# Patient Record
Sex: Female | Born: 1955 | Hispanic: No | Smoking: Never smoker
Health system: Southern US, Community
[De-identification: ages and names within clinical notes are randomized; demographics above are authoritative.]

## PROBLEM LIST (undated history)

## (undated) DIAGNOSIS — E785 Hyperlipidemia, unspecified: Secondary | ICD-10-CM

## (undated) DIAGNOSIS — E119 Type 2 diabetes mellitus without complications: Secondary | ICD-10-CM

## (undated) DIAGNOSIS — I10 Essential (primary) hypertension: Secondary | ICD-10-CM

## (undated) HISTORY — PX: CARDIAC CATHETERIZATION: SHX172

---

## 2008-01-04 ENCOUNTER — Emergency Department (HOSPITAL_COMMUNITY): Admission: EM | Admit: 2008-01-04 | Discharge: 2008-01-04 | Payer: Self-pay | Admitting: Emergency Medicine

## 2014-10-13 ENCOUNTER — Ambulatory Visit: Payer: Self-pay | Admitting: General Practice

## 2015-10-05 ENCOUNTER — Other Ambulatory Visit: Payer: Self-pay | Admitting: Family Medicine

## 2015-10-05 ENCOUNTER — Ambulatory Visit
Admission: RE | Admit: 2015-10-05 | Discharge: 2015-10-05 | Disposition: A | Discharge status: Home / Self Care | Payer: No Typology Code available for payment source | Source: Ambulatory Visit | Attending: Family Medicine | Admitting: Family Medicine

## 2015-10-05 DIAGNOSIS — Z1231 Encounter for screening mammogram for malignant neoplasm of breast: Secondary | ICD-10-CM | POA: Diagnosis not present

## 2016-07-10 ENCOUNTER — Other Ambulatory Visit: Payer: Self-pay | Admitting: Family Medicine

## 2016-07-11 LAB — CMP12+LP+TP+TSH+6AC+CBC/D/PLT
A/G RATIO: 1.8 (ref 1.2–2.2)
ALBUMIN: 4.4 g/dL (ref 3.6–4.8)
ALT: 18 IU/L (ref 0–32)
AST: 17 IU/L (ref 0–40)
Alkaline Phosphatase: 67 IU/L (ref 39–117)
BASOS ABS: 0 10*3/uL (ref 0.0–0.2)
BILIRUBIN TOTAL: 0.3 mg/dL (ref 0.0–1.2)
BUN / CREAT RATIO: 21 (ref 12–28)
BUN: 12 mg/dL (ref 8–27)
Basos: 1 %
CHOLESTEROL TOTAL: 261 mg/dL — AB (ref 100–199)
CREATININE: 0.57 mg/dL (ref 0.57–1.00)
Calcium: 9.4 mg/dL (ref 8.7–10.3)
Chloride: 103 mmol/L (ref 96–106)
Chol/HDL Ratio: 5.4 ratio units — ABNORMAL HIGH (ref 0.0–4.4)
EOS (ABSOLUTE): 0.3 10*3/uL (ref 0.0–0.4)
ESTIMATED CHD RISK: 1.5 times avg. — AB (ref 0.0–1.0)
Eos: 5 %
FREE THYROXINE INDEX: 1.7 (ref 1.2–4.9)
GFR calc Af Amer: 117 mL/min/{1.73_m2} (ref >59)
GFR, EST NON AFRICAN AMERICAN: 101 mL/min/{1.73_m2} (ref >59)
GGT: 29 IU/L (ref 0–60)
GLUCOSE: 137 mg/dL — AB (ref 65–99)
Globulin, Total: 2.4 g/dL (ref 1.5–4.5)
HDL: 48 mg/dL (ref >39)
Hematocrit: 42.5 % (ref 34.0–46.6)
Hemoglobin: 13.4 g/dL (ref 11.1–15.9)
IRON: 62 ug/dL (ref 27–159)
Immature Grans (Abs): 0 10*3/uL (ref 0.0–0.1)
Immature Granulocytes: 0 %
LDH: 157 IU/L (ref 119–226)
LDL Calculated: 183 mg/dL — ABNORMAL HIGH (ref 0–99)
LYMPHS ABS: 1.4 10*3/uL (ref 0.7–3.1)
Lymphs: 28 %
MCH: 27.1 pg (ref 26.6–33.0)
MCHC: 31.5 g/dL (ref 31.5–35.7)
MCV: 86 fL (ref 79–97)
MONOS ABS: 0.4 10*3/uL (ref 0.1–0.9)
Monocytes: 7 %
NEUTROS PCT: 59 %
Neutrophils Absolute: 3 10*3/uL (ref 1.4–7.0)
PHOSPHORUS: 3.3 mg/dL (ref 2.5–4.5)
PLATELETS: 205 10*3/uL (ref 150–379)
Potassium: 4.2 mmol/L (ref 3.5–5.2)
RBC: 4.95 x10E6/uL (ref 3.77–5.28)
RDW: 14.4 % (ref 12.3–15.4)
Sodium: 140 mmol/L (ref 134–144)
T3 UPTAKE RATIO: 21 % — AB (ref 24–39)
T4 TOTAL: 7.9 ug/dL (ref 4.5–12.0)
TOTAL PROTEIN: 6.8 g/dL (ref 6.0–8.5)
TSH: 1.51 u[IU]/mL (ref 0.450–4.500)
Triglycerides: 149 mg/dL (ref 0–149)
URIC ACID: 4.9 mg/dL (ref 2.5–7.1)
VLDL Cholesterol Cal: 30 mg/dL (ref 5–40)
WBC: 5.1 10*3/uL (ref 3.4–10.8)

## 2016-07-11 LAB — VITAMIN D 25 HYDROXY (VIT D DEFICIENCY, FRACTURES): VIT D 25 HYDROXY: 18.5 ng/mL — AB (ref 30.0–100.0)

## 2016-07-11 LAB — HGB A1C W/O EAG: HEMOGLOBIN A1C: 6.6 % — AB (ref 4.8–5.6)

## 2021-02-10 DIAGNOSIS — I2510 Atherosclerotic heart disease of native coronary artery without angina pectoris: Secondary | ICD-10-CM

## 2021-02-10 HISTORY — DX: Atherosclerotic heart disease of native coronary artery without angina pectoris: I25.10

## 2021-04-06 ENCOUNTER — Encounter (HOSPITAL_COMMUNITY): Payer: Self-pay | Admitting: *Deleted

## 2021-04-06 NOTE — Progress Notes (Signed)
Received [facility] [id number] from Dr. Andee Poles at the [facility] for this pt to participate in Cardiac rehab s/p 3/24 STEMI and DES to RCA at [facility].  Reviewed accompany progress notes which included post hospitalization follow up with her PCP Dr. Vinnie Level on 4/8 and cardiology clinic on 4/4.  Pt is progressing well. Called and left message for [facility] contact in Care in the community requesting a copy of the 12 lead ekg tracing that was completed on 4/4. Called and verified with pt that [place] [facility] [place] [facility] she would like to participate in [place].  Pt indicated that she would.  Pt made aware that we have a long wait list for scheduling. Verbalized understanding as she will be leaving next week traveling for a period of time.will forward to support staff to contact pt for scheduling during her turn on the wait list Karlene Lineman RN, BSN Cardiac and Pulmonary Rehab Nurse Navigator

## 2021-05-13 ENCOUNTER — Telehealth (HOSPITAL_COMMUNITY): Payer: Self-pay

## 2021-05-13 ENCOUNTER — Encounter (HOSPITAL_COMMUNITY): Payer: Self-pay

## 2021-05-13 NOTE — Telephone Encounter (Signed)
Attempted to call patient in regards to Cardiac Rehab - LM on VM Mailed letter 

## 2021-05-27 NOTE — Telephone Encounter (Signed)
  Pt called and stated she is interested in CR. Patient will come in for orientation on 05/31/21 @10 :30AM and will attend the 1:15PM exercise class.    Pt called and stated she is interested in CR. Patient will come in for orientation on 05/31/21 @10 :30AM and will attend the 1:15PM exercise class.   06/02/21.

## 2021-05-30 ENCOUNTER — Telehealth (HOSPITAL_COMMUNITY): Payer: Self-pay | Admitting: *Deleted

## 2021-05-30 ENCOUNTER — Telehealth (HOSPITAL_COMMUNITY): Payer: Self-pay | Admitting: Pharmacist

## 2021-05-30 NOTE — Telephone Encounter (Signed)
Left message to call cardiac rehab regarding appointment for cardiac rehab orientation.Gladstone Lighter, RN,BSN 05/30/2021 4:29 PM

## 2021-05-31 ENCOUNTER — Encounter (HOSPITAL_COMMUNITY)
Admission: RE | Admit: 2021-05-31 | Discharge: 2021-05-31 | Disposition: A | Discharge status: Home / Self Care | Payer: No Typology Code available for payment source | Source: Ambulatory Visit | Attending: Cardiology | Admitting: Cardiology

## 2021-05-31 ENCOUNTER — Encounter (HOSPITAL_COMMUNITY): Payer: Self-pay

## 2021-05-31 ENCOUNTER — Other Ambulatory Visit: Payer: Self-pay

## 2021-05-31 VITALS — BP 112/68 | HR 77 | Ht 66.5 in | Wt 190.0 lb

## 2021-05-31 DIAGNOSIS — Z955 Presence of coronary angioplasty implant and graft: Secondary | ICD-10-CM | POA: Diagnosis present

## 2021-05-31 DIAGNOSIS — I213 ST elevation (STEMI) myocardial infarction of unspecified site: Secondary | ICD-10-CM | POA: Insufficient documentation

## 2021-05-31 HISTORY — DX: Hyperlipidemia, unspecified: E78.5

## 2021-05-31 HISTORY — DX: Essential (primary) hypertension: I10

## 2021-05-31 HISTORY — DX: Type 2 diabetes mellitus without complications: E11.9

## 2021-05-31 LAB — GLUCOSE, CAPILLARY: Glucose-Capillary: 168 mg/dL — ABNORMAL HIGH (ref 70–99)

## 2021-05-31 NOTE — Progress Notes (Signed)
Cardiac Rehab Medication Review by a Nurse  Does the patient  feel that his/her medications are working for him/her?  yes  Has the patient been experiencing any side effects to the medications prescribed?  no  Does the patient measure his/her own blood pressure or blood glucose at home?  yes   Does the patient have any problems obtaining medications due to transportation or finances?   no  Understanding of regimen: good Understanding of indications: good Potential of compliance: good    Nurse comments: Medications  and allergies reviewed and updated. Patient brought bottle. Harika is taking her medications as prescribed and has a good understanding of what they are for. Adelle checks her blood pressures at home but does not check her CBG's.    Arta Bruce [facility] RN 05/31/2021 11:13 AM

## 2021-05-31 NOTE — Progress Notes (Signed)
Cardiac Individual Treatment Plan  Patient Details  Name: Taylor Frazier MRN: [id number] Date of Birth: April 07, 1956 Referring Provider:   Flowsheet Row CARDIAC REHAB PHASE II ORIENTATION from 05/31/2021 in [facility] CARDIAC [facility]  Referring Provider Wanchese, [place], MD ([place])  Mayford Knife, Cornelious Bryant, MD (covering)]       Initial Encounter Date:  Flowsheet Row CARDIAC REHAB PHASE II ORIENTATION from 05/31/2021 in [facility] CARDIAC REHAB  Date 05/31/21       Visit Diagnosis: 03/10/21 STEMI [facility]  03/10/21 DES RCA  Patient's Home Medications on Admission:  Current Outpatient Medications:    aspirin 81 MG chewable tablet, Chew 81 mg by mouth daily., Disp: , Rfl:    atorvastatin (LIPITOR) 80 MG tablet, Take 80 mg by mouth daily., Disp: , Rfl:    Cholecalciferol 25 MCG (1000 UT) tablet, Take 1,000 Units by mouth daily., Disp: , Rfl:    metFORMIN (GLUCOPHAGE) 500 MG tablet, Take 500 mg by mouth 2 (two) times daily with a meal., Disp: , Rfl:    metoprolol succinate (TOPROL-XL) 25 MG 24 hr tablet, Take 12.5 mg by mouth daily., Disp: , Rfl:    Multiple Vitamin (MULTIVITAMIN WITH MINERALS) TABS tablet, Take 1 tablet by mouth daily., Disp: , Rfl:    nitroGLYCERIN (NITROSTAT) 0.4 MG SL tablet, Place 0.4 mg under the tongue every 5 (five) minutes as needed for chest pain., Disp: , Rfl:    prasugrel (EFFIENT) 10 MG TABS tablet, Take 10 mg by mouth daily., Disp: , Rfl:   Past Medical History: Past Medical History:  Diagnosis Date   Coronary artery disease 02/10/2021   STEMI Late Presenting DES RCA at [facility]   Diabetes mellitus without complication (HCC)    Hyperlipidemia    Hypertension     Tobacco Use: Social History   Tobacco Use  Smoking Status Never  Smokeless Tobacco Never    Labs: Recent Review Flowsheet Data     Labs for ITP Cardiac and Pulmonary Rehab Latest Ref Rng & Units 07/10/2016    Cholestrol 100 - 199 mg/dL 361(W)   LDLCALC 0 - 99 mg/dL 431(V)   HDL >40 mg/dL 48   Trlycerides 0 - 086 mg/dL 761   Hemoglobin P5K 4.8 - 5.6 % 6.6(H)       Capillary Blood Glucose: Lab Results  Component Value Date   GLUCAP 168 (H) 05/31/2021     Exercise Target Goals: Exercise Program Goal: Individual exercise prescription set using results from initial 6 min walk test and THRR while considering  patient's activity barriers and safety.   Exercise Prescription Goal: Starting with aerobic activity 30 plus minutes a day, 3 days per week for initial exercise prescription. Provide home exercise prescription and guidelines that participant acknowledges understanding prior to discharge.  Activity Barriers & Risk Stratification:  Activity Barriers & Cardiac Risk Stratification - 05/31/21 1237       Activity Barriers & Cardiac Risk Stratification   Activity Barriers None    Cardiac Risk Stratification High             6 Minute Walk:  6 Minute Walk     Row Name 05/31/21 1137         6 Minute Walk   Phase Initial     Distance 1931 feet     Walk Time 6 minutes     # of Rest Breaks 0     MPH 3.66  METS 4.39     RPE 11     Perceived Dyspnea  0     VO2 Peak 15.35     Symptoms No     Resting HR 77 bpm     Resting BP 112/68     Resting Oxygen Saturation  97 %     Exercise Oxygen Saturation  during 6 min walk 98 %     Max Ex. HR 117 bpm     Max Ex. BP 144/72     2 Minute Post BP 128/84              Oxygen Initial Assessment:   Oxygen Re-Evaluation:   Oxygen Discharge (Final Oxygen Re-Evaluation):   Initial Exercise Prescription:  Initial Exercise Prescription - 05/31/21 1200       Date of Initial Exercise RX and Referring Provider   Date 05/31/21    Referring Provider Sarajane Marek, [place] ([place]   Quintella Reichert, MD (covering)   Expected Discharge Date 07/29/21      Treadmill   MPH 2.6    Grade 1    Minutes 15    METs 3.35      NuStep    Level 3    SPM 85    Minutes 15    METs 2.5      Prescription Details   Frequency (times per week) 3    Duration Progress to 30 minutes of continuous aerobic without signs/symptoms of physical distress      Intensity   THRR 40-80% of Max Heartrate 62-124    Ratings of Perceived Exertion 11-13    Perceived Dyspnea 0-4      Progression   Progression Continue to progress workloads to maintain intensity without signs/symptoms of physical distress.      Resistance Training   Training Prescription Yes    Weight 3 lbs    Reps 10-15             Perform Capillary Blood Glucose checks as needed.  Exercise Prescription Changes:   Exercise Comments:   Exercise Goals and Review:   Exercise Goals     Row Name 05/31/21 1043             Exercise Goals   Increase Physical Activity Yes       Intervention Provide advice, education, support and counseling about physical activity/exercise needs.;Develop an individualized exercise prescription for aerobic and resistive training based on initial evaluation findings, risk stratification, comorbidities and participant's personal goals.       Expected Outcomes Short Term: Attend rehab on a regular basis to increase amount of physical activity.;Long Term: Exercising regularly at least 3-5 days a week.;Long Term: Add in home exercise to make exercise part of routine and to increase amount of physical activity.       Increase Strength and Stamina Yes       Intervention Provide advice, education, support and counseling about physical activity/exercise needs.;Develop an individualized exercise prescription for aerobic and resistive training based on initial evaluation findings, risk stratification, comorbidities and participant's personal goals.       Expected Outcomes Short Term: Increase workloads from initial exercise prescription for resistance, speed, and METs.;Short Term: Perform resistance training exercises routinely during rehab and add  in resistance training at home;Long Term: Improve cardiorespiratory fitness, muscular endurance and strength as measured by increased METs and functional capacity ( )       Able to understand and use rate of perceived exertion (RPE) scale Yes  Intervention Provide education and explanation on how to use RPE scale       Expected Outcomes Short Term: Able to use RPE daily in rehab to express subjective intensity level;Long Term:  Able to use RPE to guide intensity level when exercising independently       Knowledge and understanding of Target Heart Rate Range (THRR) Yes       Intervention Provide education and explanation of THRR including how the numbers were predicted and where they are located for reference       Expected Outcomes Short Term: Able to state/look up THRR;Long Term: Able to use THRR to govern intensity when exercising independently;Short Term: Able to use daily as guideline for intensity in rehab       Able to check pulse independently Yes       Intervention Provide education and demonstration on how to check pulse in carotid and radial arteries.;Review the importance of being able to check your own pulse for safety during independent exercise       Expected Outcomes Short Term: Able to explain why pulse checking is important during independent exercise;Long Term: Able to check pulse independently and accurately       Understanding of Exercise Prescription Yes       Intervention Provide education, explanation, and written materials on patient's individual exercise prescription       Expected Outcomes Short Term: Able to explain program exercise prescription;Long Term: Able to explain home exercise prescription to exercise independently                Exercise Goals Re-Evaluation :    Discharge Exercise Prescription (Final Exercise Prescription Changes):   Nutrition:  Target Goals: Understanding of nutrition guidelines, daily intake of sodium 1500mg , cholesterol  200mg , calories 30% from fat and 7% or less from saturated fats, daily to have 5 or more servings of fruits and vegetables.  Biometrics:  Pre Biometrics - 05/31/21 1024       Pre Biometrics   Waist Circumference 42 inches    Hip Circumference 43.25 inches    Waist to Hip Ratio 0.97 %    Triceps Skinfold 25 mm    % Body Fat 41.6 %    Grip Strength 41.5 kg    Flexibility 11.75 in    Single Leg Stand 30 seconds              Nutrition Therapy Plan and Nutrition Goals:   Nutrition Assessments:  MEDIFICTS Score Key: ?70 Need to make dietary changes  40-70 Heart Healthy Diet ? 40 Therapeutic Level Cholesterol Diet   Picture Your Plate Scores: <40<40 Unhealthy dietary pattern with much room for improvement. 41-50 Dietary pattern unlikely to meet recommendations for good health and room for improvement. 51-60 More healthful dietary pattern, with some room for improvement.  >60 Healthy dietary pattern, although there may be some specific behaviors that could be improved.    Nutrition Goals Re-Evaluation:   Nutrition Goals Discharge (Final Nutrition Goals Re-Evaluation):   Psychosocial: Target Goals: Acknowledge presence or absence of significant depression and/or stress, maximize coping skills, provide positive support system. Participant is able to verbalize types and ability to use techniques and skills needed for reducing stress and depression.  Initial Review & Psychosocial Screening:  Initial Psych Review & Screening - 05/31/21 1123       Initial Review   Current issues with Current Stress Concerns    Source of Stress Concerns Family    Comments Corina's mother has  dementia. Ladonne's mother lives in [place]. Amran has the help of her siblings in the care of her mother      Family Dynamics   Good Support System? Yes   Aliviana lives alone. Yen has her 3 children who live out of state and her siblings for support     Barriers   Psychosocial barriers to  participate in program The patient should benefit from training in stress management and relaxation.      Screening Interventions   Interventions Encouraged to exercise    Expected Outcomes Long Term Goal: Stressors or current issues are controlled or eliminated.;Short Term goal: Identification and review with participant of any Quality of Life or Depression concerns found by scoring the questionnaire.             Quality of Life Scores:  Quality of Life - 05/31/21 1230       Quality of Life   Select Quality of Life      Quality of Life Scores   Health/Function Pre 18.67 %    Socioeconomic Pre 16.21 %    Psych/Spiritual Pre 15.5 %    Family Pre 22.5 %    GLOBAL Pre 18.07 %            Scores of 19 and below usually indicate a poorer quality of life in these areas.  A difference of  2-3 points is a clinically meaningful difference.  A difference of 2-3 points in the total score of the Quality of Life Index has been associated with significant improvement in overall quality of life, self-image, physical symptoms, and general health in studies assessing change in quality of life.  PHQ-9: Recent Review Flowsheet Data     Depression screen [facility] 2/9 05/31/2021   Decreased Interest 0   Down, Depressed, Hopeless 0   PHQ - 2 Score 0      Interpretation of Total Score  Total Score Depression Severity:  1-4 = Minimal depression, 5-9 = Mild depression, 10-14 = Moderate depression, 15-19 = Moderately severe depression, 20-27 = Severe depression   Psychosocial Evaluation and Intervention:   Psychosocial Re-Evaluation:   Psychosocial Discharge (Final Psychosocial Re-Evaluation):   Vocational Rehabilitation: Provide vocational rehab assistance to qualifying candidates.   Vocational Rehab Evaluation & Intervention:  Vocational Rehab - 05/31/21 1127       Initial Vocational Rehab Evaluation & Intervention   Assessment shows need for Vocational Rehabilitation No   Jacalynn is  retired and does not need vocational rehab at this time.            Education: Education Goals: Education classes will be provided on a weekly basis, covering required topics. Participant will state understanding/return demonstration of topics presented.  Learning Barriers/Preferences:  Learning Barriers/Preferences - 05/31/21 1516       Learning Barriers/Preferences   Learning Barriers Sight    Learning Preferences Skilled Demonstration;Pictoral;Video;Computer/Internet             Education Topics: Hypertension, Hypertension Reduction -Define heart disease and high blood pressure. Discus how high blood pressure affects the body and ways to reduce high blood pressure.   Exercise and Your Heart -Discuss why it is important to exercise, the FITT principles of exercise, normal and abnormal responses to exercise, and how to exercise safely.   Angina -Discuss definition of angina, causes of angina, treatment of angina, and how to decrease risk of having angina.   Cardiac Medications -Review what the following cardiac medications are used for, how they affect  the body, and side effects that may occur when taking the medications.  Medications include Aspirin, Beta blockers, calcium channel blockers, ACE Inhibitors, angiotensin receptor blockers, diuretics, digoxin, and antihyperlipidemics.   Congestive Heart Failure -Discuss the definition of CHF, how to live with CHF, the signs and symptoms of CHF, and how keep track of weight and sodium intake.   Heart Disease and Intimacy -Discus the effect sexual activity has on the heart, how changes occur during intimacy as we age, and safety during sexual activity.   Smoking Cessation / COPD -Discuss different methods to quit smoking, the health benefits of quitting smoking, and the definition of COPD.   Nutrition I: Fats -Discuss the types of cholesterol, what cholesterol does to the heart, and how cholesterol levels can be  controlled.   Nutrition II: Labels -Discuss the different components of food labels and how to read food label   Heart Parts/Heart Disease and PAD -Discuss the anatomy of the heart, the pathway of blood circulation through the heart, and these are affected by heart disease.   Stress I: Signs and Symptoms -Discuss the causes of stress, how stress may lead to anxiety and depression, and ways to limit stress.   Stress II: Relaxation -Discuss different types of relaxation techniques to limit stress.   Warning Signs of Stroke / TIA -Discuss definition of a stroke, what the signs and symptoms are of a stroke, and how to identify when someone is having stroke.   Knowledge Questionnaire Score:  Knowledge Questionnaire Score - 05/31/21 1230       Knowledge Questionnaire Score   Pre Score 18/24             Core Components/Risk Factors/Patient Goals at Admission:  Personal Goals and Risk Factors at Admission - 05/31/21 1231       Core Components/Risk Factors/Patient Goals on Admission    Weight Management Yes;Obesity    Intervention Weight Management/Obesity: Establish reasonable short term and long term weight goals.;Obesity: Provide education and appropriate resources to help participant work on and attain dietary goals.    Admit Weight 190 lb 0.6 oz (86.2 kg)    Expected Outcomes Short Term: Continue to assess and modify interventions until short term weight is achieved;Long Term: Adherence to nutrition and physical activity/exercise program aimed toward attainment of established weight goal;Weight Loss: Understanding of general recommendations for a balanced deficit meal plan, which promotes 1-2 lb weight loss per week and includes a negative energy balance of [phone] kcal/d;Understanding recommendations for meals to include 15-35% energy as protein, 25-35% energy from fat, 35-60% energy from carbohydrates, less than  of dietary cholesterol, 20-35 gm of total fiber  daily;Understanding of distribution of calorie intake throughout the day with the consumption of 4-5 meals/snacks    Diabetes Yes    Intervention Provide education about signs/symptoms and action to take for hypo/hyperglycemia.;Provide education about proper nutrition, including hydration, and aerobic/resistive exercise prescription along with prescribed medications to achieve blood glucose in normal ranges: Fasting glucose 65-99 mg/dL    Expected Outcomes Short Term: Participant verbalizes understanding of the signs/symptoms and immediate care of hyper/hypoglycemia, proper foot care and importance of medication, aerobic/resistive exercise and nutrition plan for blood glucose control.;Long Term: Attainment of HbA1C < 7%.    Hypertension Yes    Intervention Provide education on lifestyle modifcations including regular physical activity/exercise, weight management, moderate sodium restriction and increased consumption of fresh fruit, vegetables, and low fat dairy, alcohol moderation, and smoking cessation.;Monitor prescription use compliance.    Expected  Outcomes Long Term: Maintenance of blood pressure at goal levels.;Short Term: Continued assessment and intervention until BP is < 140/80mm HG in hypertensive participants. < 130/51mm HG in hypertensive participants with diabetes, heart failure or chronic kidney disease.    Lipids Yes    Intervention Provide education and support for participant on nutrition & aerobic/resistive exercise along with prescribed medications to achieve LDL 70mg , HDL >40mg .    Expected Outcomes Short Term: Participant states understanding of desired cholesterol values and is compliant with medications prescribed. Participant is following exercise prescription and nutrition guidelines.;Long Term: Cholesterol controlled with medications as prescribed, with individualized exercise RX and with personalized nutrition plan. Value goals: LDL < 70mg , HDL > 40 mg.    Stress Yes     Intervention Offer individual and/or small group education and counseling on adjustment to heart disease, stress management and health-related lifestyle change. Teach and support self-help strategies.;Refer participants experiencing significant psychosocial distress to appropriate mental health specialists for further evaluation and treatment. When possible, include family members and significant others in education/counseling sessions.    Expected Outcomes Short Term: Participant demonstrates changes in health-related behavior, relaxation and other stress management skills, ability to obtain effective social support, and compliance with psychotropic medications if prescribed.;Long Term: Emotional wellbeing is indicated by absence of clinically significant psychosocial distress or social isolation.             Core Components/Risk Factors/Patient Goals Review:    Core Components/Risk Factors/Patient Goals at Discharge (Final Review):    ITP Comments:  ITP Comments     Row Name 05/31/21 1024           ITP Comments Medical Director- Dr. 06/02/21, MD                Comments:Negar attended orientation on 05/31/2021 to review rules and guidelines for program.  Completed 6 minute walk test, Intitial ITP, and exercise prescription.  VSS. Telemetry-Sinus Rhythm downward QRS t wave inversion.  Asymptomatic. Safety measures and social distancing in place per CDC guidelines. 06/02/2021, RN,BSN 05/31/2021 3:27 PM

## 2021-06-06 ENCOUNTER — Other Ambulatory Visit: Payer: Self-pay

## 2021-06-06 ENCOUNTER — Encounter (HOSPITAL_COMMUNITY)
Admission: RE | Admit: 2021-06-06 | Discharge: 2021-06-06 | Disposition: A | Discharge status: Home / Self Care | Payer: No Typology Code available for payment source | Source: Ambulatory Visit | Attending: Cardiology | Admitting: Cardiology

## 2021-06-06 DIAGNOSIS — I213 ST elevation (STEMI) myocardial infarction of unspecified site: Secondary | ICD-10-CM | POA: Diagnosis not present

## 2021-06-06 DIAGNOSIS — Z955 Presence of coronary angioplasty implant and graft: Secondary | ICD-10-CM

## 2021-06-06 LAB — GLUCOSE, CAPILLARY
Glucose-Capillary: 78 mg/dL (ref 70–99)
Glucose-Capillary: 105 mg/dL — ABNORMAL HIGH (ref 70–99)

## 2021-06-06 NOTE — Progress Notes (Signed)
Cardiac Individual Treatment Plan  Patient Details  Name: Taylor Frazier MRN: [id number] Date of Birth: 1956-03-15 Referring Provider:   Flowsheet Row CARDIAC REHAB PHASE II ORIENTATION from 05/31/2021 in [facility] CARDIAC [facility]  Referring Provider Romancoke, Duque, MD ([place])  Mayford Knife, Cornelious Bryant, MD (covering)]       Initial Encounter Date:  Flowsheet Row CARDIAC REHAB PHASE II ORIENTATION from 05/31/2021 in [facility] CARDIAC REHAB  Date 05/31/21       Visit Diagnosis: 03/10/21 STEMI [facility]  03/10/21 DES RCA  Patient's Home Medications on Admission:  Current Outpatient Medications:    aspirin 81 MG chewable tablet, Chew 81 mg by mouth daily., Disp: , Rfl:    atorvastatin (LIPITOR) 80 MG tablet, Take 80 mg by mouth daily., Disp: , Rfl:    Cholecalciferol 25 MCG (1000 UT) tablet, Take 1,000 Units by mouth daily., Disp: , Rfl:    metFORMIN (GLUCOPHAGE) 500 MG tablet, Take 500 mg by mouth 2 (two) times daily with a meal., Disp: , Rfl:    metoprolol succinate (TOPROL-XL) 25 MG 24 hr tablet, Take 12.5 mg by mouth daily., Disp: , Rfl:    Multiple Vitamin (MULTIVITAMIN WITH MINERALS) TABS tablet, Take 1 tablet by mouth daily., Disp: , Rfl:    nitroGLYCERIN (NITROSTAT) 0.4 MG SL tablet, Place 0.4 mg under the tongue every 5 (five) minutes as needed for chest pain., Disp: , Rfl:    prasugrel (EFFIENT) 10 MG TABS tablet, Take 10 mg by mouth daily., Disp: , Rfl:   Past Medical History: Past Medical History:  Diagnosis Date   Coronary artery disease 02/10/2021   STEMI Late Presenting DES RCA at [facility]   Diabetes mellitus without complication (HCC)    Hyperlipidemia    Hypertension     Tobacco Use: Social History   Tobacco Use  Smoking Status Never  Smokeless Tobacco Never    Labs: Recent Review Flowsheet Data     Labs for ITP Cardiac and Pulmonary Rehab Latest Ref Rng & Units 07/10/2016    Cholestrol 100 - 199 mg/dL 409(W)   LDLCALC 0 - 99 mg/dL 119(J)   HDL >47 mg/dL 48   Trlycerides 0 - 829 mg/dL 562   Hemoglobin Z3Y 4.8 - 5.6 % 6.6(H)       Capillary Blood Glucose: Lab Results  Component Value Date   GLUCAP 105 (H) 06/06/2021   GLUCAP 78 06/06/2021   GLUCAP 133 (H) 06/06/2021   GLUCAP 168 (H) 05/31/2021     Exercise Target Goals: Exercise Program Goal: Individual exercise prescription set using results from initial 6 min walk test and THRR while considering  patient's activity barriers and safety.   Exercise Prescription Goal: Starting with aerobic activity 30 plus minutes a day, 3 days per week for initial exercise prescription. Provide home exercise prescription and guidelines that participant acknowledges understanding prior to discharge.  Activity Barriers & Risk Stratification:  Activity Barriers & Cardiac Risk Stratification - 05/31/21 1237       Activity Barriers & Cardiac Risk Stratification   Activity Barriers None    Cardiac Risk Stratification High             6 Minute Walk:  6 Minute Walk     Row Name 05/31/21 1137         6 Minute Walk   Phase Initial     Distance 1931 feet     Walk Time 6 minutes     #  of Rest Breaks 0     MPH 3.66     METS 4.39     RPE 11     Perceived Dyspnea  0     VO2 Peak 15.35     Symptoms No     Resting HR 77 bpm     Resting BP 112/68     Resting Oxygen Saturation  97 %     Exercise Oxygen Saturation  during 6 min walk 98 %     Max Ex. HR 117 bpm     Max Ex. BP 144/72     2 Minute Post BP 128/84              Oxygen Initial Assessment:   Oxygen Re-Evaluation:   Oxygen Discharge (Final Oxygen Re-Evaluation):   Initial Exercise Prescription:  Initial Exercise Prescription - 05/31/21 1200       Date of Initial Exercise RX and Referring Provider   Date 05/31/21    Referring Provider Sarajane Marek, MD ([place])   Quintella Reichert, MD (covering)   Expected Discharge Date 07/29/21       Treadmill   MPH 2.6    Grade 1    Minutes 15    METs 3.35      NuStep   Level 3    SPM 85    Minutes 15    METs 2.5      Prescription Details   Frequency (times per week) 3    Duration Progress to 30 minutes of continuous aerobic without signs/symptoms of physical distress      Intensity   THRR 40-80% of Max Heartrate 62-124    Ratings of Perceived Exertion 11-13    Perceived Dyspnea 0-4      Progression   Progression Continue to progress workloads to maintain intensity without signs/symptoms of physical distress.      Resistance Training   Training Prescription Yes    Weight 3 lbs    Reps 10-15             Perform Capillary Blood Glucose checks as needed.  Exercise Prescription Changes:   Exercise Prescription Changes     Row Name 06/06/21 1322             Response to Exercise   Blood Pressure (Admit) 118/66       Blood Pressure (Exercise) 132/72       Blood Pressure (Exit) 112/80       Heart Rate (Admit) 86 bpm       Heart Rate (Exercise) 104 bpm       Heart Rate (Exit) 81 bpm       Rating of Perceived Exertion (Exercise) 11       Symptoms none       Comments Off to a good start with exercise.       Duration Continue with 30 min of aerobic exercise without signs/symptoms of physical distress.       Intensity THRR unchanged               Progression     Progression Continue to progress workloads to maintain intensity without signs/symptoms of physical distress.       Average METs 2.9               Resistance Training     Training Prescription Yes       Weight 3 lbs       Reps 10-15       Time  2.9 Minutes               Interval Training     Interval Training No               NuStep     Level 3       SPM 85       Minutes 15       METs 3               Track     Laps 15       Minutes 15       METs 2.74               Exercise Comments:   Exercise Comments     Row Name 06/06/21 1417           Exercise Comments  Patient tolerated low intensity exercise well without symptoms. Patient preferred to walk track instead of treadmill.                Exercise Goals and Review:   Exercise Goals     Row Name 05/31/21 1043             Exercise Goals   Increase Physical Activity Yes       Intervention Provide advice, education, support and counseling about physical activity/exercise needs.;Develop an individualized exercise prescription for aerobic and resistive training based on initial evaluation findings, risk stratification, comorbidities and participant's personal goals.       Expected Outcomes Short Term: Attend rehab on a regular basis to increase amount of physical activity.;Long Term: Exercising regularly at least 3-5 days a week.;Long Term: Add in home exercise to make exercise part of routine and to increase amount of physical activity.       Increase Strength and Stamina Yes       Intervention Provide advice, education, support and counseling about physical activity/exercise needs.;Develop an individualized exercise prescription for aerobic and resistive training based on initial evaluation findings, risk stratification, comorbidities and participant's personal goals.       Expected Outcomes Short Term: Increase workloads from initial exercise prescription for resistance, speed, and METs.;Short Term: Perform resistance training exercises routinely during rehab and add in resistance training at home;Long Term: Improve cardiorespiratory fitness, muscular endurance and strength as measured by increased METs and functional capacity ( )       Able to understand and use rate of perceived exertion (RPE) scale Yes       Intervention Provide education and explanation on how to use RPE scale       Expected Outcomes Short Term: Able to use RPE daily in rehab to express subjective intensity level;Long Term:  Able to use RPE to guide intensity level when exercising independently       Knowledge and  understanding of Target Heart Rate Range (THRR) Yes       Intervention Provide education and explanation of THRR including how the numbers were predicted and where they are located for reference       Expected Outcomes Short Term: Able to state/look up THRR;Long Term: Able to use THRR to govern intensity when exercising independently;Short Term: Able to use daily as guideline for intensity in rehab       Able to check pulse independently Yes       Intervention Provide education and demonstration on how to check pulse in carotid and radial arteries.;Review the importance of being able to check your own pulse for safety during  independent exercise       Expected Outcomes Short Term: Able to explain why pulse checking is important during independent exercise;Long Term: Able to check pulse independently and accurately       Understanding of Exercise Prescription Yes       Intervention Provide education, explanation, and written materials on patient's individual exercise prescription       Expected Outcomes Short Term: Able to explain program exercise prescription;Long Term: Able to explain home exercise prescription to exercise independently                Exercise Goals Re-Evaluation :  Exercise Goals Re-Evaluation     Row Name 06/06/21 1417             Exercise Goal Re-Evaluation   Exercise Goals Review Increase Physical Activity;Able to understand and use rate of perceived exertion (RPE) scale       Comments Patient able to understand and use RPE scale appropriately.       Expected Outcomes Progress workloads as tolerated to help improve cardiorespiratory fitness.                 Discharge Exercise Prescription (Final Exercise Prescription Changes):  Exercise Prescription Changes - 06/06/21 1322       Response to Exercise   Blood Pressure (Admit) 118/66    Blood Pressure (Exercise) 132/72    Blood Pressure (Exit) 112/80    Heart Rate (Admit) 86 bpm    Heart Rate  (Exercise) 104 bpm    Heart Rate (Exit) 81 bpm    Rating of Perceived Exertion (Exercise) 11    Symptoms none    Comments Off to a good start with exercise.    Duration Continue with 30 min of aerobic exercise without signs/symptoms of physical distress.    Intensity THRR unchanged      Progression   Progression Continue to progress workloads to maintain intensity without signs/symptoms of physical distress.    Average METs 2.9      Resistance Training   Training Prescription Yes    Weight 3 lbs    Reps 10-15    Time 2.9 Minutes      Interval Training   Interval Training No      NuStep   Level 3    SPM 85    Minutes 15    METs 3      Track   Laps 15    Minutes 15    METs 2.74             Nutrition:  Target Goals: Understanding of nutrition guidelines, daily intake of sodium 1500mg , cholesterol 200mg , calories 30% from fat and 7% or less from saturated fats, daily to have 5 or more servings of fruits and vegetables.  Biometrics:  Pre Biometrics - 05/31/21 1024       Pre Biometrics   Waist Circumference 42 inches    Hip Circumference 43.25 inches    Waist to Hip Ratio 0.97 %    Triceps Skinfold 25 mm    % Body Fat 41.6 %    Grip Strength 41.5 kg    Flexibility 11.75 in    Single Leg Stand 30 seconds              Nutrition Therapy Plan and Nutrition Goals:   Nutrition Assessments:  MEDIFICTS Score Key: ?70 Need to make dietary changes  40-70 Heart Healthy Diet ? 40 Therapeutic Level Cholesterol Diet   Picture Your Plate Scores: <  40 Unhealthy dietary pattern with much room for improvement. 41-50 Dietary pattern unlikely to meet recommendations for good health and room for improvement. 51-60 More healthful dietary pattern, with some room for improvement.  >60 Healthy dietary pattern, although there may be some specific behaviors that could be improved.    Nutrition Goals Re-Evaluation:   Nutrition Goals Discharge (Final Nutrition Goals  Re-Evaluation):   Psychosocial: Target Goals: Acknowledge presence or absence of significant depression and/or stress, maximize coping skills, provide positive support system. Participant is able to verbalize types and ability to use techniques and skills needed for reducing stress and depression.  Initial Review & Psychosocial Screening:  Initial Psych Review & Screening - 05/31/21 1123       Initial Review   Current issues with Current Stress Concerns    Source of Stress Concerns Family    Comments Nashley's mother has dementia. Nakayla's mother lives in [place]. Oveta has the help of her siblings in the care of her mother      Family Dynamics   Good Support System? Yes   Abbigal lives alone. Kashayla has her 3 children who live out of state and her siblings for support     Barriers   Psychosocial barriers to participate in program The patient should benefit from training in stress management and relaxation.      Screening Interventions   Interventions Encouraged to exercise    Expected Outcomes Long Term Goal: Stressors or current issues are controlled or eliminated.;Short Term goal: Identification and review with participant of any Quality of Life or Depression concerns found by scoring the questionnaire.             Quality of Life Scores:  Quality of Life - 05/31/21 1230       Quality of Life   Select Quality of Life      Quality of Life Scores   Health/Function Pre 18.67 %    Socioeconomic Pre 16.21 %    Psych/Spiritual Pre 15.5 %    Family Pre 22.5 %    GLOBAL Pre 18.07 %            Scores of 19 and below usually indicate a poorer quality of life in these areas.  A difference of  2-3 points is a clinically meaningful difference.  A difference of 2-3 points in the total score of the Quality of Life Index has been associated with significant improvement in overall quality of life, self-image, physical symptoms, and general health in studies assessing change  in quality of life.  PHQ-9: Recent Review Flowsheet Data     Depression screen [facility] 2/9 05/31/2021   Decreased Interest 0   Down, Depressed, Hopeless 0   PHQ - 2 Score 0      Interpretation of Total Score  Total Score Depression Severity:  1-4 = Minimal depression, 5-9 = Mild depression, 10-14 = Moderate depression, 15-19 = Moderately severe depression, 20-27 = Severe depression   Psychosocial Evaluation and Intervention:   Psychosocial Re-Evaluation:  Psychosocial Re-Evaluation     Row Name 06/06/21 1715             Psychosocial Re-Evaluation   Current issues with Current Stress Concerns       Comments will review quality of life questionnaire in the upcoming sessions and offer support as needed.       Expected Outcomes Gayren's stressor's will be reduced or controlled upon completion of phase 2 cardiac rehab.       Interventions  Encouraged to attend Cardiac Rehabilitation for the exercise;Stress management education       Continue Psychosocial Services  Follow up required by counselor               Initial Review     Source of Stress Concerns Family       Comments Ignacia FellingGayrene continues to have stress from health concerns of her elderly mother.               Psychosocial Discharge (Final Psychosocial Re-Evaluation):  Psychosocial Re-Evaluation - 06/06/21 1715       Psychosocial Re-Evaluation   Current issues with Current Stress Concerns    Comments will review quality of life questionnaire in the upcoming sessions and offer support as needed.    Expected Outcomes Gayren's stressor's will be reduced or controlled upon completion of phase 2 cardiac rehab.    Interventions Encouraged to attend Cardiac Rehabilitation for the exercise;Stress management education    Continue Psychosocial Services  Follow up required by counselor      Initial Review   Source of Stress Concerns Family    Comments Ignacia FellingGayrene continues to have stress from health concerns of her elderly mother.              Vocational Rehabilitation: Provide vocational rehab assistance to qualifying candidates.   Vocational Rehab Evaluation & Intervention:  Vocational Rehab - 05/31/21 1127       Initial Vocational Rehab Evaluation & Intervention   Assessment shows need for Vocational Rehabilitation No   Ignacia FellingGayrene is retired and does not need vocational rehab at this time.            Education: Education Goals: Education classes will be provided on a weekly basis, covering required topics. Participant will state understanding/return demonstration of topics presented.  Learning Barriers/Preferences:  Learning Barriers/Preferences - 05/31/21 1516       Learning Barriers/Preferences   Learning Barriers Sight    Learning Preferences Skilled Demonstration;Pictoral;Video;Computer/Internet             Education Topics: Hypertension, Hypertension Reduction -Define heart disease and high blood pressure. Discus how high blood pressure affects the body and ways to reduce high blood pressure.   Exercise and Your Heart -Discuss why it is important to exercise, the FITT principles of exercise, normal and abnormal responses to exercise, and how to exercise safely.   Angina -Discuss definition of angina, causes of angina, treatment of angina, and how to decrease risk of having angina.   Cardiac Medications -Review what the following cardiac medications are used for, how they affect the body, and side effects that may occur when taking the medications.  Medications include Aspirin, Beta blockers, calcium channel blockers, ACE Inhibitors, angiotensin receptor blockers, diuretics, digoxin, and antihyperlipidemics.   Congestive Heart Failure -Discuss the definition of CHF, how to live with CHF, the signs and symptoms of CHF, and how keep track of weight and sodium intake.   Heart Disease and Intimacy -Discus the effect sexual activity has on the heart, how changes occur during  intimacy as we age, and safety during sexual activity.   Smoking Cessation / COPD -Discuss different methods to quit smoking, the health benefits of quitting smoking, and the definition of COPD.   Nutrition I: Fats -Discuss the types of cholesterol, what cholesterol does to the heart, and how cholesterol levels can be controlled.   Nutrition II: Labels -Discuss the different components of food labels and how to read food label   Heart Parts/Heart Disease  and PAD -Discuss the anatomy of the heart, the pathway of blood circulation through the heart, and these are affected by heart disease.   Stress I: Signs and Symptoms -Discuss the causes of stress, how stress may lead to anxiety and depression, and ways to limit stress.   Stress II: Relaxation -Discuss different types of relaxation techniques to limit stress.   Warning Signs of Stroke / TIA -Discuss definition of a stroke, what the signs and symptoms are of a stroke, and how to identify when someone is having stroke.   Knowledge Questionnaire Score:  Knowledge Questionnaire Score - 05/31/21 1230       Knowledge Questionnaire Score   Pre Score 18/24             Core Components/Risk Factors/Patient Goals at Admission:  Personal Goals and Risk Factors at Admission - 05/31/21 1231       Core Components/Risk Factors/Patient Goals on Admission    Weight Management Yes;Obesity    Intervention Weight Management/Obesity: Establish reasonable short term and long term weight goals.;Obesity: Provide education and appropriate resources to help participant work on and attain dietary goals.    Admit Weight 190 lb 0.6 oz (86.2 kg)    Expected Outcomes Short Term: Continue to assess and modify interventions until short term weight is achieved;Long Term: Adherence to nutrition and physical activity/exercise program aimed toward attainment of established weight goal;Weight Loss: Understanding of general recommendations for a balanced  deficit meal plan, which promotes 1-2 lb weight loss per week and includes a negative energy balance of [phone] kcal/d;Understanding recommendations for meals to include 15-35% energy as protein, 25-35% energy from fat, 35-60% energy from carbohydrates, less than  of dietary cholesterol, 20-35 gm of total fiber daily;Understanding of distribution of calorie intake throughout the day with the consumption of 4-5 meals/snacks    Diabetes Yes    Intervention Provide education about signs/symptoms and action to take for hypo/hyperglycemia.;Provide education about proper nutrition, including hydration, and aerobic/resistive exercise prescription along with prescribed medications to achieve blood glucose in normal ranges: Fasting glucose 65-99 mg/dL    Expected Outcomes Short Term: Participant verbalizes understanding of the signs/symptoms and immediate care of hyper/hypoglycemia, proper foot care and importance of medication, aerobic/resistive exercise and nutrition plan for blood glucose control.;Long Term: Attainment of HbA1C < 7%.    Hypertension Yes    Intervention Provide education on lifestyle modifcations including regular physical activity/exercise, weight management, moderate sodium restriction and increased consumption of fresh fruit, vegetables, and low fat dairy, alcohol moderation, and smoking cessation.;Monitor prescription use compliance.    Expected Outcomes Long Term: Maintenance of blood pressure at goal levels.;Short Term: Continued assessment and intervention until BP is < 140/30mm HG in hypertensive participants. < 130/61mm HG in hypertensive participants with diabetes, heart failure or chronic kidney disease.    Lipids Yes    Intervention Provide education and support for participant on nutrition & aerobic/resistive exercise along with prescribed medications to achieve LDL 70mg , HDL >40mg .    Expected Outcomes Short Term: Participant states understanding of desired cholesterol values  and is compliant with medications prescribed. Participant is following exercise prescription and nutrition guidelines.;Long Term: Cholesterol controlled with medications as prescribed, with individualized exercise RX and with personalized nutrition plan. Value goals: LDL < , HDL > 40 mg.    Stress Yes    Intervention Offer individual and/or small group education and counseling on adjustment to heart disease, stress management and health-related lifestyle change. Teach and support self-help strategies.;Refer participants experiencing significant  psychosocial distress to appropriate mental health specialists for further evaluation and treatment. When possible, include family members and significant others in education/counseling sessions.    Expected Outcomes Short Term: Participant demonstrates changes in health-related behavior, relaxation and other stress management skills, ability to obtain effective social support, and compliance with psychotropic medications if prescribed.;Long Term: Emotional wellbeing is indicated by absence of clinically significant psychosocial distress or social isolation.             Core Components/Risk Factors/Patient Goals Review:   Goals and Risk Factor Review     Row Name 06/06/21 1720             Core Components/Risk Factors/Patient Goals Review   Personal Goals Review Weight Management/Obesity;Stress;Hypertension;Lipids;Diabetes       Review Gelsey started exercise on 06/06/21 and did well with exercise. post exercise CBG 78. patient asymptomatic. Repeat CBG 105       Expected Outcomes Trishelle will continue to participate in phase 2 exercise for exercise, nutrition and lifestyle modification                Core Components/Risk Factors/Patient Goals at Discharge (Final Review):   Goals and Risk Factor Review - 06/06/21 1720       Core Components/Risk Factors/Patient Goals Review   Personal Goals Review Weight  Management/Obesity;Stress;Hypertension;Lipids;Diabetes    Review Zenna started exercise on 06/06/21 and did well with exercise. post exercise CBG 78. patient asymptomatic. Repeat CBG 105    Expected Outcomes Itati will continue to participate in phase 2 exercise for exercise, nutrition and lifestyle modification             ITP Comments:  ITP Comments     Row Name 05/31/21 1024 06/06/21 1713         ITP Comments Medical Director- Dr. Armanda Magic, MD 30 Day ITP Review. Alannah started exercise on 06/06/21 and did well with exercise.               Comments: See ITP comments.Gladstone Lighter, RN,BSN 06/07/2021 2:01 PM

## 2021-06-06 NOTE — Progress Notes (Signed)
Daily Session Note  Patient Details  Name: Taylor Frazier MRN: [id number] Date of Birth: Apr 27, 1956 Referring Provider:   Flowsheet Row CARDIAC REHAB PHASE II ORIENTATION from 05/31/2021 in [place]  Referring Provider Ravine, Dryden, MD ([place])  Radford Pax, Eber Hong, MD (covering)]       Encounter Date: 06/06/2021  Check In:  Session Check In - 06/06/21 1327       Check-In   Supervising physician immediately available to respond to emergencies [facility] immediately available    Physician(s) Dr. Arbutus Ped    Location [facility]    Staff Present Seward Carol, MS, ACSM CEP, Exercise Physiologist;Lorenso Quirino Venetia Maxon, RN, BSN;Ramon Dredge, RN, MHA;Jessica Hassell Done, MS, ACSM-CEP, Exercise Physiologist;Jetta Gilford Rile BS, ACSM EP-C, Exercise Physiologist    Virtual Visit No    Medication changes reported     No    Fall or balance concerns reported    No    Tobacco Cessation No Change    Warm-up and Cool-down Performed on first and last piece of equipment   Cardiac Rehab Orientation   Resistance Training Performed Yes    VAD Patient? No    PAD/SET Patient? No      Pain Assessment   Currently in Pain? No/denies    Pain Score 0-No pain    Multiple Pain Sites No             Capillary Blood Glucose: Results for orders placed or performed during the hospital encounter of 06/06/21 (from the past 24 hour(s))  Glucose, capillary     Status: None   Collection Time: 06/06/21  2:18 PM  Result Value Ref Range   Glucose-Capillary 78 70 - 99 mg/dL  Glucose, capillary     Status: Abnormal   Collection Time: 06/06/21  2:34 PM  Result Value Ref Range   Glucose-Capillary 105 (H) 70 - 99 mg/dL     Exercise Prescription Changes - 06/06/21 1322       Response to Exercise   Blood Pressure (Admit) 118/66    Blood Pressure (Exercise) 132/72    Blood Pressure (Exit) 112/80    Heart Rate (Admit) 86 bpm    Heart Rate (Exercise) 104 bpm     Heart Rate (Exit) 81 bpm    Rating of Perceived Exertion (Exercise) 11    Symptoms none    Comments Off to a good start with exercise.    Duration Continue with 30 min of aerobic exercise without signs/symptoms of physical distress.    Intensity THRR unchanged      Progression   Progression Continue to progress workloads to maintain intensity without signs/symptoms of physical distress.    Average METs 2.9      Resistance Training   Training Prescription Yes    Weight 3 lbs    Reps 10-15    Time 2.9 Minutes      Interval Training   Interval Training No      NuStep   Level 3    SPM 85    Minutes 15    METs 3      Track   Laps 15    Minutes 15    METs 2.74             Social History   Tobacco Use  Smoking Status Never  Smokeless Tobacco Never    Goals Met:  Exercise tolerated well No report of cardiac concerns or symptoms Strength training completed today  Goals Unmet:  Not  Applicable  Comments: [name] started cardiac rehab today.  Pt tolerated light exercise without difficulty. VSS, telemetry-Sinus Rhythm downward QRS, asymptomatic.  Medication list reconciled. Pt denies barriers to medicaiton compliance.  PSYCHOSOCIAL ASSESSMENT:  PHQ-0. [name] admits having some stress due to helping with the care of her elderly mother    Pt enjoys reading and staying active.   Pt oriented to exercise equipment and routine.    Understanding verbalized. Barnet Pall, RN,BSN 06/06/2021 5:24 PM    Dr. Fransico Him is Medical Director for Cardiac Rehab at [facility].

## 2021-06-07 LAB — GLUCOSE, CAPILLARY: Glucose-Capillary: 133 mg/dL — ABNORMAL HIGH (ref 70–99)

## 2021-06-08 ENCOUNTER — Other Ambulatory Visit: Payer: Self-pay

## 2021-06-08 ENCOUNTER — Encounter (HOSPITAL_COMMUNITY)
Admission: RE | Admit: 2021-06-08 | Discharge: 2021-06-08 | Disposition: A | Discharge status: Home / Self Care | Payer: No Typology Code available for payment source | Source: Ambulatory Visit | Attending: Cardiology | Admitting: Cardiology

## 2021-06-08 DIAGNOSIS — Z955 Presence of coronary angioplasty implant and graft: Secondary | ICD-10-CM

## 2021-06-08 DIAGNOSIS — I213 ST elevation (STEMI) myocardial infarction of unspecified site: Secondary | ICD-10-CM

## 2021-06-08 LAB — GLUCOSE, CAPILLARY
Glucose-Capillary: 114 mg/dL — ABNORMAL HIGH (ref 70–99)
Glucose-Capillary: 98 mg/dL (ref 70–99)

## 2021-06-08 NOTE — Progress Notes (Signed)
QUALITY OF LIFE SCORE REVIEW  Pt completed Quality of Life survey as a participant in Cardiac Rehab.  Scores 21.0 or below are considered low.  Pt score very low in several areas Overall 18.07, Health and Function 18.67, socioeconomic 16.21, physiological and spiritual 16.21, family 22.5. Patient quality of life slightly altered by physical constraints which limits ability to perform as prior to recent cardiac illness.Taylor Frazier denies being depressed admits to having some stress due to her recent MI/ Stent and the health of her elderly mother .  Offered emotional support and reassurance.  Will continue to monitor and intervene as necessary. Taylor Frazier does not want her quality of life questionnaire forwarded to the [place]. Taylor Frazier is looking forward to exercising and learning better eating habits.Gladstone Lighter, RN,BSN 06/08/2021 2:42 PM

## 2021-06-08 NOTE — Progress Notes (Signed)
Taylor Frazier 65 y.o. female Nutrition Note  Diagnosis: STEMI, DES RCA  Past Medical History:  Diagnosis Date   Coronary artery disease 02/10/2021   STEMI Late Presenting DES RCA at [facility]   Diabetes mellitus without complication (HCC)    Hyperlipidemia    Hypertension      Medications reviewed.   Current Outpatient Medications:    aspirin 81 MG chewable tablet, Chew 81 mg by mouth daily., Disp: , Rfl:    atorvastatin (LIPITOR) 80 MG tablet, Take 80 mg by mouth daily., Disp: , Rfl:    Cholecalciferol 25 MCG (1000 UT) tablet, Take 1,000 Units by mouth daily., Disp: , Rfl:    metFORMIN (GLUCOPHAGE) 500 MG tablet, Take 500 mg by mouth 2 (two) times daily with a meal., Disp: , Rfl:    metoprolol succinate (TOPROL-XL) 25 MG 24 hr tablet, Take 12.5 mg by mouth daily., Disp: , Rfl:    Multiple Vitamin (MULTIVITAMIN WITH MINERALS) TABS tablet, Take 1 tablet by mouth daily., Disp: , Rfl:    nitroGLYCERIN (NITROSTAT) 0.4 MG SL tablet, Place 0.4 mg under the tongue every 5 (five) minutes as needed for chest pain., Disp: , Rfl:    prasugrel (EFFIENT) 10 MG TABS tablet, Take 10 mg by mouth daily., Disp: , Rfl:    Ht Readings from Last 1 Encounters:  05/31/21 5' 6.5" (1.689 m)     Wt Readings from Last 3 Encounters:  05/31/21 190 lb 0.6 oz (86.2 kg)     There is no height or weight on file to calculate BMI.   Social History   Tobacco Use  Smoking Status Never  Smokeless Tobacco Never     Lab Results  Component Value Date   CHOL 261 (H) 07/10/2016   Lab Results  Component Value Date   HDL 48 07/10/2016   Lab Results  Component Value Date   LDLCALC 183 (H) 07/10/2016   Lab Results  Component Value Date   TRIG 149 07/10/2016     Lab Results  Component Value Date   HGBA1C 6.6 (H) 07/10/2016     CBG (last 3)  Recent Labs    06/06/21 1418 06/06/21 1434 06/08/21 1309  GLUCAP 78 105* 114*     Nutrition Note  Spoke with pt.  Nutrition Plan and Nutrition Survey goals reviewed with pt.   Pt has Type 2 Diabetes. Last A1c indicates blood glucose well-controlled. Pt does not check CBGs at home. Pt is also taking cinnamon, apple cider vinegar as well.   Per discussion, pt does use canned/convenience foods often. Pt does add salt to food. Pt does eat out frequently.  Noted LDL 126 mg/dl. She is trying to get it down. We discussed fiber, plant stanols/sterols, and eating at home more often.  Pt normally cooks breakfast (Eggs and bacon), eats out at lunch and dinner.  No sugary beverages.  She's trying to reduce fried foods.  Pt expressed understanding of the information reviewed.    Nutrition Diagnosis Food-and nutrition-related knowledge deficit related to lack of exposure to information as related to diagnosis of: ? CVD ? Type 2 Diabetes  Nutrition Intervention Pt's individual nutrition plan reviewed with pt. Benefits of adopting Heart Healthy diet discussed when Picture Your Plate reviewed.  Pt given handouts for: ? Nutrition I class ? Nutrition II class ?  Continue client-centered nutrition education by RD, as part of interdisciplinary care.  Goal(s)  Pt to build a healthy plate including vegetables, fruits, whole grains,  and low-fat dairy products in a heart healthy meal plan. Pt to incorporate 25-30 g fiber; 5-10 g soluble fiber  Pt to reduce fried foods Plan:   Will provide client-centered nutrition education as part of interdisciplinary care Monitor and evaluate progress toward nutrition goal with team.   Andrey Campanile, MS, RDN, LDN

## 2021-06-10 ENCOUNTER — Other Ambulatory Visit: Payer: Self-pay

## 2021-06-10 ENCOUNTER — Encounter (HOSPITAL_COMMUNITY)
Admission: RE | Admit: 2021-06-10 | Discharge: 2021-06-10 | Disposition: A | Discharge status: Home / Self Care | Payer: No Typology Code available for payment source | Source: Ambulatory Visit | Attending: Cardiology | Admitting: Cardiology

## 2021-06-10 DIAGNOSIS — I213 ST elevation (STEMI) myocardial infarction of unspecified site: Secondary | ICD-10-CM | POA: Diagnosis not present

## 2021-06-10 DIAGNOSIS — Z955 Presence of coronary angioplasty implant and graft: Secondary | ICD-10-CM

## 2021-06-10 LAB — GLUCOSE, CAPILLARY
Glucose-Capillary: 86 mg/dL (ref 70–99)
Glucose-Capillary: 94 mg/dL (ref 70–99)

## 2021-06-13 ENCOUNTER — Other Ambulatory Visit: Payer: Self-pay

## 2021-06-13 ENCOUNTER — Encounter (HOSPITAL_COMMUNITY)
Admission: RE | Admit: 2021-06-13 | Discharge: 2021-06-13 | Disposition: A | Discharge status: Home / Self Care | Payer: No Typology Code available for payment source | Source: Ambulatory Visit | Attending: Cardiology | Admitting: Cardiology

## 2021-06-13 DIAGNOSIS — I213 ST elevation (STEMI) myocardial infarction of unspecified site: Secondary | ICD-10-CM | POA: Diagnosis not present

## 2021-06-13 DIAGNOSIS — Z955 Presence of coronary angioplasty implant and graft: Secondary | ICD-10-CM

## 2021-06-15 ENCOUNTER — Other Ambulatory Visit: Payer: Self-pay

## 2021-06-15 ENCOUNTER — Encounter (HOSPITAL_COMMUNITY)
Admission: RE | Admit: 2021-06-15 | Discharge: 2021-06-15 | Disposition: A | Discharge status: Home / Self Care | Payer: No Typology Code available for payment source | Source: Ambulatory Visit | Attending: Cardiology | Admitting: Cardiology

## 2021-06-15 DIAGNOSIS — I213 ST elevation (STEMI) myocardial infarction of unspecified site: Secondary | ICD-10-CM

## 2021-06-15 DIAGNOSIS — Z955 Presence of coronary angioplasty implant and graft: Secondary | ICD-10-CM

## 2021-06-17 ENCOUNTER — Encounter (HOSPITAL_COMMUNITY)
Admission: RE | Admit: 2021-06-17 | Discharge: 2021-06-17 | Disposition: A | Discharge status: Home / Self Care | Payer: No Typology Code available for payment source | Source: Ambulatory Visit | Attending: Cardiology | Admitting: Cardiology

## 2021-06-17 ENCOUNTER — Other Ambulatory Visit: Payer: Self-pay

## 2021-06-17 DIAGNOSIS — I252 Old myocardial infarction: Secondary | ICD-10-CM | POA: Insufficient documentation

## 2021-06-17 DIAGNOSIS — Z955 Presence of coronary angioplasty implant and graft: Secondary | ICD-10-CM | POA: Insufficient documentation

## 2021-06-17 DIAGNOSIS — Z5189 Encounter for other specified aftercare: Secondary | ICD-10-CM | POA: Insufficient documentation

## 2021-06-17 DIAGNOSIS — I213 ST elevation (STEMI) myocardial infarction of unspecified site: Secondary | ICD-10-CM

## 2021-06-22 ENCOUNTER — Encounter (HOSPITAL_COMMUNITY)
Admission: RE | Admit: 2021-06-22 | Discharge: 2021-06-22 | Disposition: A | Discharge status: Home / Self Care | Payer: No Typology Code available for payment source | Source: Ambulatory Visit | Attending: Cardiology | Admitting: Cardiology

## 2021-06-22 ENCOUNTER — Other Ambulatory Visit: Payer: Self-pay

## 2021-06-22 DIAGNOSIS — Z5189 Encounter for other specified aftercare: Secondary | ICD-10-CM | POA: Diagnosis not present

## 2021-06-22 DIAGNOSIS — Z955 Presence of coronary angioplasty implant and graft: Secondary | ICD-10-CM

## 2021-06-22 DIAGNOSIS — I213 ST elevation (STEMI) myocardial infarction of unspecified site: Secondary | ICD-10-CM

## 2021-06-22 LAB — GLUCOSE, CAPILLARY: Glucose-Capillary: 124 mg/dL — ABNORMAL HIGH (ref 70–99)

## 2021-06-24 ENCOUNTER — Other Ambulatory Visit: Payer: Self-pay

## 2021-06-24 ENCOUNTER — Encounter (HOSPITAL_COMMUNITY)
Admission: RE | Admit: 2021-06-24 | Discharge: 2021-06-24 | Disposition: A | Discharge status: Home / Self Care | Payer: No Typology Code available for payment source | Source: Ambulatory Visit | Attending: Cardiology | Admitting: Cardiology

## 2021-06-24 DIAGNOSIS — Z955 Presence of coronary angioplasty implant and graft: Secondary | ICD-10-CM

## 2021-06-24 DIAGNOSIS — I213 ST elevation (STEMI) myocardial infarction of unspecified site: Secondary | ICD-10-CM

## 2021-06-24 DIAGNOSIS — Z5189 Encounter for other specified aftercare: Secondary | ICD-10-CM | POA: Diagnosis not present

## 2021-06-27 ENCOUNTER — Other Ambulatory Visit: Payer: Self-pay

## 2021-06-27 ENCOUNTER — Encounter (HOSPITAL_COMMUNITY)
Admission: RE | Admit: 2021-06-27 | Discharge: 2021-06-27 | Disposition: A | Discharge status: Home / Self Care | Payer: No Typology Code available for payment source | Source: Ambulatory Visit | Attending: Cardiology | Admitting: Cardiology

## 2021-06-27 DIAGNOSIS — I213 ST elevation (STEMI) myocardial infarction of unspecified site: Secondary | ICD-10-CM

## 2021-06-27 DIAGNOSIS — Z955 Presence of coronary angioplasty implant and graft: Secondary | ICD-10-CM

## 2021-06-27 DIAGNOSIS — Z5189 Encounter for other specified aftercare: Secondary | ICD-10-CM | POA: Diagnosis not present

## 2021-06-29 ENCOUNTER — Encounter (HOSPITAL_COMMUNITY)
Admission: RE | Admit: 2021-06-29 | Discharge: 2021-06-29 | Disposition: A | Discharge status: Home / Self Care | Payer: No Typology Code available for payment source | Source: Ambulatory Visit | Attending: Cardiology | Admitting: Cardiology

## 2021-06-29 ENCOUNTER — Other Ambulatory Visit: Payer: Self-pay

## 2021-06-29 DIAGNOSIS — I213 ST elevation (STEMI) myocardial infarction of unspecified site: Secondary | ICD-10-CM

## 2021-06-29 DIAGNOSIS — Z5189 Encounter for other specified aftercare: Secondary | ICD-10-CM | POA: Diagnosis not present

## 2021-06-29 DIAGNOSIS — Z955 Presence of coronary angioplasty implant and graft: Secondary | ICD-10-CM

## 2021-07-01 ENCOUNTER — Other Ambulatory Visit: Payer: Self-pay

## 2021-07-01 ENCOUNTER — Encounter (HOSPITAL_COMMUNITY)
Admission: RE | Admit: 2021-07-01 | Discharge: 2021-07-01 | Disposition: A | Discharge status: Home / Self Care | Payer: No Typology Code available for payment source | Source: Ambulatory Visit | Attending: Cardiology | Admitting: Cardiology

## 2021-07-01 DIAGNOSIS — Z955 Presence of coronary angioplasty implant and graft: Secondary | ICD-10-CM

## 2021-07-01 DIAGNOSIS — I213 ST elevation (STEMI) myocardial infarction of unspecified site: Secondary | ICD-10-CM

## 2021-07-01 DIAGNOSIS — Z5189 Encounter for other specified aftercare: Secondary | ICD-10-CM | POA: Diagnosis not present

## 2021-07-04 ENCOUNTER — Encounter (HOSPITAL_COMMUNITY)
Admission: RE | Admit: 2021-07-04 | Discharge: 2021-07-04 | Disposition: A | Discharge status: Home / Self Care | Payer: No Typology Code available for payment source | Source: Ambulatory Visit | Attending: Cardiology | Admitting: Cardiology

## 2021-07-04 ENCOUNTER — Other Ambulatory Visit: Payer: Self-pay

## 2021-07-04 DIAGNOSIS — Z5189 Encounter for other specified aftercare: Secondary | ICD-10-CM | POA: Diagnosis not present

## 2021-07-04 DIAGNOSIS — Z955 Presence of coronary angioplasty implant and graft: Secondary | ICD-10-CM

## 2021-07-04 DIAGNOSIS — I213 ST elevation (STEMI) myocardial infarction of unspecified site: Secondary | ICD-10-CM

## 2021-07-05 NOTE — Progress Notes (Signed)
Reviewed home exercise Rx with patient. Encouraged patient to walk 2-3 x/week for 30 minutes. Encouraged warm-up, cool-down, and stretching. Reviewed THRR of 62-124 and keeping RPE between 11-13. Hydration encouraged. Discussed weather parameters for temperature and humidity for safe exercise outdoors. Reviewed S/S to terminate exercise and when to call 911 vs MD. Reviewed use of NTG. Pt verbalized understanding of the home exercise Rx and was provided a copy.  Lorin Picket MS, ACSM-CEP, CCRP

## 2021-07-06 ENCOUNTER — Encounter (HOSPITAL_COMMUNITY)
Admission: RE | Admit: 2021-07-06 | Discharge: 2021-07-06 | Disposition: A | Discharge status: Home / Self Care | Payer: No Typology Code available for payment source | Source: Ambulatory Visit | Attending: Cardiology | Admitting: Cardiology

## 2021-07-06 ENCOUNTER — Other Ambulatory Visit: Payer: Self-pay

## 2021-07-06 DIAGNOSIS — Z955 Presence of coronary angioplasty implant and graft: Secondary | ICD-10-CM

## 2021-07-06 DIAGNOSIS — Z5189 Encounter for other specified aftercare: Secondary | ICD-10-CM | POA: Diagnosis not present

## 2021-07-06 DIAGNOSIS — I213 ST elevation (STEMI) myocardial infarction of unspecified site: Secondary | ICD-10-CM

## 2021-07-07 NOTE — Progress Notes (Signed)
Cardiac Individual Treatment Plan  Patient Details  Name: Taylor Frazier MRN: [id number] Date of Birth: [birth year] Referring Provider:   Flowsheet Row CARDIAC REHAB PHASE II ORIENTATION from 05/31/2021 in [facility] CARDIAC [facility]  Referring Provider Becker, [place], MD ([place])  Mayford Knife, Cornelious Bryant, MD (covering)]       Initial Encounter Date:  Flowsheet Row CARDIAC REHAB PHASE II ORIENTATION from 05/31/2021 in [facility] CARDIAC REHAB  Date 05/31/21       Visit Diagnosis: 03/10/21 STEMI [facility]  03/10/21 DES RCA  Patient's Home Medications on Admission:  Current Outpatient Medications:    aspirin 81 MG chewable tablet, Chew 81 mg by mouth daily., Disp: , Rfl:    atorvastatin (LIPITOR) 80 MG tablet, Take 80 mg by mouth daily., Disp: , Rfl:    Cholecalciferol 25 MCG (1000 UT) tablet, Take 1,000 Units by mouth daily., Disp: , Rfl:    metFORMIN (GLUCOPHAGE) 500 MG tablet, Take 500 mg by mouth 2 (two) times daily with a meal., Disp: , Rfl:    metoprolol succinate (TOPROL-XL) 25 MG 24 hr tablet, Take 12.5 mg by mouth daily., Disp: , Rfl:    Multiple Vitamin (MULTIVITAMIN WITH MINERALS) TABS tablet, Take 1 tablet by mouth daily., Disp: , Rfl:    nitroGLYCERIN (NITROSTAT) 0.4 MG SL tablet, Place 0.4 mg under the tongue every 5 (five) minutes as needed for chest pain., Disp: , Rfl:    prasugrel (EFFIENT) 10 MG TABS tablet, Take 10 mg by mouth daily., Disp: , Rfl:   Past Medical History: Past Medical History:  Diagnosis Date   Coronary artery disease 02/10/2021   STEMI Late Presenting DES RCA at [facility]   Diabetes mellitus without complication (HCC)    Hyperlipidemia    Hypertension     Tobacco Use: Social History   Tobacco Use  Smoking Status Never  Smokeless Tobacco Never    Labs: Recent Review Flowsheet Data     Labs for ITP Cardiac and Pulmonary Rehab Latest Ref Rng & Units 07/10/2016    Cholestrol 100 - 199 mg/dL 621(H)   LDLCALC 0 - 99 mg/dL 086(V)   HDL >78 mg/dL 48   Trlycerides 0 - 469 mg/dL 629   Hemoglobin B2W 4.8 - 5.6 % 6.6(H)       Capillary Blood Glucose: Lab Results  Component Value Date   GLUCAP 124 (H) 06/22/2021   GLUCAP 94 06/10/2021   GLUCAP 86 06/10/2021   GLUCAP 98 06/08/2021   GLUCAP 114 (H) 06/08/2021     Exercise Target Goals: Exercise Program Goal: Individual exercise prescription set using results from initial 6 min walk test and THRR while considering  patient's activity barriers and safety.   Exercise Prescription Goal: Starting with aerobic activity 30 plus minutes a day, 3 days per week for initial exercise prescription. Provide home exercise prescription and guidelines that participant acknowledges understanding prior to discharge.  Activity Barriers & Risk Stratification:  Activity Barriers & Cardiac Risk Stratification - 05/31/21 1237       Activity Barriers & Cardiac Risk Stratification   Activity Barriers None    Cardiac Risk Stratification High             6 Minute Walk:  6 Minute Walk     Row Name 05/31/21 1137         6 Minute Walk   Phase Initial     Distance 1931 feet     Walk  Time 6 minutes     # of Rest Breaks 0     MPH 3.66     METS 4.39     RPE 11     Perceived Dyspnea  0     VO2 Peak 15.35     Symptoms No     Resting HR 77 bpm     Resting BP 112/68     Resting Oxygen Saturation  97 %     Exercise Oxygen Saturation  during 6 min walk 98 %     Max Ex. HR 117 bpm     Max Ex. BP 144/72     2 Minute Post BP 128/84              Oxygen Initial Assessment:   Oxygen Re-Evaluation:   Oxygen Discharge (Final Oxygen Re-Evaluation):   Initial Exercise Prescription:  Initial Exercise Prescription - 05/31/21 1200       Date of Initial Exercise RX and Referring Provider   Date 05/31/21    Referring Provider Sarajane Marek, MD ([place])   Quintella Reichert, MD (covering)   Expected Discharge  Date 07/29/21      Treadmill   MPH 2.6    Grade 1    Minutes 15    METs 3.35      NuStep   Level 3    SPM 85    Minutes 15    METs 2.5      Prescription Details   Frequency (times per week) 3    Duration Progress to 30 minutes of continuous aerobic without signs/symptoms of physical distress      Intensity   THRR 40-80% of Max Heartrate 62-124    Ratings of Perceived Exertion 11-13    Perceived Dyspnea 0-4      Progression   Progression Continue to progress workloads to maintain intensity without signs/symptoms of physical distress.      Resistance Training   Training Prescription Yes    Weight 3 lbs    Reps 10-15             Perform Capillary Blood Glucose checks as needed.  Exercise Prescription Changes:   Exercise Prescription Changes     Row Name 06/06/21 1322 06/22/21 1500 06/29/21 1500 07/04/21 1430       Response to Exercise   Blood Pressure (Admit) 118/66 118/62 110/62 128/74    Blood Pressure (Exercise) 132/72 124/72 138/70 144/70    Blood Pressure (Exit) 112/80 124/70 122/70 112/62    Heart Rate (Admit) 86 bpm 91 bpm 98 bpm 78 bpm    Heart Rate (Exercise) 104 bpm 123 bpm 111 bpm 115 bpm    Heart Rate (Exit) 81 bpm 87 bpm 95 bpm 83 bpm    Rating of Perceived Exertion (Exercise) 11 11 11 12     Symptoms none None None None    Comments Off to a good start with exercise. Reviewed METs Reviewed home exercise Rx Increased workload on Nustep, reviewed METs and goals    Duration Continue with 30 min of aerobic exercise without signs/symptoms of physical distress. Continue with 30 min of aerobic exercise without signs/symptoms of physical distress. Continue with 30 min of aerobic exercise without signs/symptoms of physical distress. Continue with 30 min of aerobic exercise without signs/symptoms of physical distress.    Intensity THRR unchanged THRR unchanged THRR unchanged THRR unchanged         Progression        Progression Continue to progress  workloads to maintain intensity without signs/symptoms of physical distress. Continue to progress workloads to maintain intensity without signs/symptoms of physical distress. Continue to progress workloads to maintain intensity without signs/symptoms of physical distress. Continue to progress workloads to maintain intensity without signs/symptoms of physical distress.    Average METs 2.9 3.5 3.2 3.4         Resistance Training        Training Prescription Yes No No Yes    Weight 3 lbs No weights on Wednesdays No weights on Wednesdays 4 lbs    Reps 10-15 -- -- 10-15    Time 2.9 Minutes -- -- 10 Minutes         Interval Training        Interval Training No No No No         NuStep        Level SPM 85 90 85 90    Minutes METs 3 3.6 2.9 3.5         Track        Laps Minutes METs 2.74 3.44 3.11 3.32         Home Exercise Plan        Plans to continue exercise at -- -- Home (comment) Home (comment)    Frequency -- -- Add 2 additional days to program exercise sessions. Add 2 additional days to program exercise sessions.    Initial Home Exercises Provided -- -- 06/29/21 06/29/21            Exercise Comments:   Exercise Comments     Row Name 06/06/21 1417 06/22/21 1500 06/23/21 0754 06/29/21 1500 07/04/21 1445   Exercise Comments Patient tolerated low intensity exercise well without symptoms. Patient preferred to walk track instead of treadmill. Reviewed METs with patient today. Pt is making excellent progress in the CRP2 program. -- Reviewed home exercise Rx with patient today. Verbalized understnading of the Rx and was provided a copy. Reviewed METs and Goals. Pt continues to make good progress here in the CRP2 program. Pt voices has started walking at home due to the hot weather. Encouraged walking indoors at home, a mall, or large store near her home.            Exercise Goals and Review:   Exercise Goals     Row  Name 05/31/21 1043             Exercise Goals   Increase Physical Activity Yes       Intervention Provide advice, education, support and counseling about physical activity/exercise needs.;Develop an individualized exercise prescription for aerobic and resistive training based on initial evaluation findings, risk stratification, comorbidities and participant's personal goals.       Expected Outcomes Short Term: Attend rehab on a regular basis to increase amount of physical activity.;Long Term: Exercising regularly at least 3-5 days a week.;Long Term: Add in home exercise to make exercise part of routine and to increase amount of physical activity.       Increase Strength and Stamina Yes       Intervention Provide advice, education, support and counseling about physical activity/exercise needs.;Develop an individualized exercise prescription for aerobic and resistive training based on initial evaluation findings, risk stratification, comorbidities and participant's personal goals.       Expected Outcomes Short Term: Increase workloads from  initial exercise prescription for resistance, speed, and METs.;Short Term: Perform resistance training exercises routinely during rehab and add in resistance training at home;Long Term: Improve cardiorespiratory fitness, muscular endurance and strength as measured by increased METs and functional capacity ( )       Able to understand and use rate of perceived exertion (RPE) scale Yes       Intervention Provide education and explanation on how to use RPE scale       Expected Outcomes Short Term: Able to use RPE daily in rehab to express subjective intensity level;Long Term:  Able to use RPE to guide intensity level when exercising independently       Knowledge and understanding of Target Heart Rate Range (THRR) Yes       Intervention Provide education and explanation of THRR including how the numbers were predicted and where they are located for reference        Expected Outcomes Short Term: Able to state/look up THRR;Long Term: Able to use THRR to govern intensity when exercising independently;Short Term: Able to use daily as guideline for intensity in rehab       Able to check pulse independently Yes       Intervention Provide education and demonstration on how to check pulse in carotid and radial arteries.;Review the importance of being able to check your own pulse for safety during independent exercise       Expected Outcomes Short Term: Able to explain why pulse checking is important during independent exercise;Long Term: Able to check pulse independently and accurately       Understanding of Exercise Prescription Yes       Intervention Provide education, explanation, and written materials on patient's individual exercise prescription       Expected Outcomes Short Term: Able to explain program exercise prescription;Long Term: Able to explain home exercise prescription to exercise independently                Exercise Goals Re-Evaluation :  Exercise Goals Re-Evaluation     Row Name 06/06/21 1417 06/29/21 1500           Exercise Goal Re-Evaluation   Exercise Goals Review Increase Physical Activity;Able to understand and use rate of perceived exertion (RPE) scale Increase Physical Activity;Increase Strength and Stamina;Able to understand and use rate of perceived exertion (RPE) scale;Knowledge and understanding of Target Heart Rate Range (THRR);Able to check pulse independently;Understanding of Exercise Prescription      Comments Patient able to understand and use RPE scale appropriately. Reviewed home exercise Rx. Pt will walk at 3-3x/week for 30-45 minutes      Expected Outcomes Progress workloads as tolerated to help improve cardiorespiratory fitness. Pt will exercise at home on her own in addtion to the CRP2 program.                Discharge Exercise Prescription (Final Exercise Prescription Changes):  Exercise Prescription Changes -  07/04/21 1430       Response to Exercise   Blood Pressure (Admit) 128/74    Blood Pressure (Exercise) 144/70    Blood Pressure (Exit) 112/62    Heart Rate (Admit) 78 bpm    Heart Rate (Exercise) 115 bpm    Heart Rate (Exit) 83 bpm    Rating of Perceived Exertion (Exercise) 12    Symptoms None    Comments Increased workload on Nustep, reviewed METs and goals    Duration Continue with 30 min of aerobic exercise without signs/symptoms of physical distress.  Intensity THRR unchanged      Progression   Progression Continue to progress workloads to maintain intensity without signs/symptoms of physical distress.    Average METs 3.4      Resistance Training   Training Prescription Yes    Weight 4 lbs    Reps 10-15    Time 10 Minutes      Interval Training   Interval Training No      NuStep   Level 5    SPM 90    Minutes 15    METs 3.5      Track   Laps 15    Minutes 20    METs 3.32      Home Exercise Plan   Plans to continue exercise at Home (comment)    Frequency Add 2 additional days to program exercise sessions.    Initial Home Exercises Provided 06/29/21             Nutrition:  Target Goals: Understanding of nutrition guidelines, daily intake of sodium 1500mg , cholesterol 200mg , calories 30% from fat and 7% or less from saturated fats, daily to have 5 or more servings of fruits and vegetables.  Biometrics:  Pre Biometrics - 05/31/21 1024       Pre Biometrics   Waist Circumference 42 inches    Hip Circumference 43.25 inches    Waist to Hip Ratio 0.97 %    Triceps Skinfold 25 mm    % Body Fat 41.6 %    Grip Strength 41.5 kg    Flexibility 11.75 in    Single Leg Stand 30 seconds              Nutrition Therapy Plan and Nutrition Goals:  Nutrition Therapy & Goals - 06/08/21 1435       Nutrition Therapy   Diet TLC    Drug/Food Interactions Statins/Certain Fruits      Personal Nutrition Goals   Nutrition Goal Pt to build a healthy plate  including vegetables, fruits, whole grains, and low-fat dairy products in a heart healthy meal plan.    Personal Goal #2 Pt to incorporate 25-30 g fiber; 5-10 g soluble fiber    Personal Goal #3 Pt to reduce fried foods      Intervention Plan   Intervention Prescribe, educate and counsel regarding individualized specific dietary modifications aiming towards targeted core components such as weight, hypertension, lipid management, diabetes, heart failure and other comorbidities.;Nutrition handout(s) given to patient.    Expected Outcomes Short Term Goal: Understand basic principles of dietary content, such as calories, fat, sodium, cholesterol and nutrients.;Long Term Goal: Adherence to prescribed nutrition plan.             Nutrition Assessments:  MEDIFICTS Score Key: ?70 Need to make dietary changes  40-70 Heart Healthy Diet ? 40 Therapeutic Level Cholesterol Diet  Flowsheet Row CARDIAC REHAB PHASE II EXERCISE from 06/08/2021 in [facility] CARDIAC REHAB  Picture Your Plate Total Score on Admission 53      Picture Your Plate Scores: <29 Unhealthy dietary pattern with much room for improvement. 41-50 Dietary pattern unlikely to meet recommendations for good health and room for improvement. 51-60 More healthful dietary pattern, with some room for improvement.  >60 Healthy dietary pattern, although there may be some specific behaviors that could be improved.    Nutrition Goals Re-Evaluation:  Nutrition Goals Re-Evaluation     Row Name 06/08/21 1436 07/04/21 1101  Goals   Current Weight 190 lb (86.2 kg) 191 lb 5.8 oz (86.8 kg)      Nutrition Goal Pt to build a healthy plate including vegetables, fruits, whole grains, and low-fat dairy products in a heart healthy meal plan. Pt to build a healthy plate including vegetables, fruits, whole grains, and low-fat dairy products in a heart healthy meal plan.             Personal Goal #2 Re-Evaluation       Personal Goal #2 Pt to incorporate 25-30 g fiber; 5-10 g soluble fiber Pt to incorporate 25-30 g fiber; 5-10 g soluble fiber             Personal Goal #3 Re-Evaluation      Personal Goal #3 Pt to reduce fried foods Pt to reduce fried foods              Nutrition Goals Discharge (Final Nutrition Goals Re-Evaluation):  Nutrition Goals Re-Evaluation - 07/04/21 1101       Goals   Current Weight 191 lb 5.8 oz (86.8 kg)    Nutrition Goal Pt to build a healthy plate including vegetables, fruits, whole grains, and low-fat dairy products in a heart healthy meal plan.      Personal Goal #2 Re-Evaluation   Personal Goal #2 Pt to incorporate 25-30 g fiber; 5-10 g soluble fiber      Personal Goal #3 Re-Evaluation   Personal Goal #3 Pt to reduce fried foods             Psychosocial: Target Goals: Acknowledge presence or absence of significant depression and/or stress, maximize coping skills, provide positive support system. Participant is able to verbalize types and ability to use techniques and skills needed for reducing stress and depression.  Initial Review & Psychosocial Screening:  Initial Psych Review & Screening - 05/31/21 1123       Initial Review   Current issues with Current Stress Concerns    Source of Stress Concerns Family    Comments Ameshia's mother has dementia. Arian's mother lives in [place]. Ignacia FellingGayrene has the help of her siblings in the care of her mother      Family Dynamics   Good Support System? Yes   Demetris lives alone. Ignacia FellingGayrene has her 3 children who live out of state and her siblings for support     Barriers   Psychosocial barriers to participate in program The patient should benefit from training in stress management and relaxation.      Screening Interventions   Interventions Encouraged to exercise    Expected Outcomes Long Term Goal: Stressors or current issues are controlled or eliminated.;Short Term goal: Identification and review with participant  of any Quality of Life or Depression concerns found by scoring the questionnaire.             Quality of Life Scores:  Quality of Life - 05/31/21 1230       Quality of Life   Select Quality of Life      Quality of Life Scores   Health/Function Pre 18.67 %    Socioeconomic Pre 16.21 %    Psych/Spiritual Pre 15.5 %    Family Pre 22.5 %    GLOBAL Pre 18.07 %            Scores of 19 and below usually indicate a poorer quality of life in these areas.  A difference of  2-3 points is a clinically meaningful difference.  A difference  of 2-3 points in the total score of the Quality of Life Index has been associated with significant improvement in overall quality of life, self-image, physical symptoms, and general health in studies assessing change in quality of life.  PHQ-9: Recent Review Flowsheet Data     Depression screen [facility] 2/9 05/31/2021   Decreased Interest 0   Down, Depressed, Hopeless 0   PHQ - 2 Score 0      Interpretation of Total Score  Total Score Depression Severity:  1-4 = Minimal depression, 5-9 = Mild depression, 10-14 = Moderate depression, 15-19 = Moderately severe depression, 20-27 = Severe depression   Psychosocial Evaluation and Intervention:   Psychosocial Re-Evaluation:  Psychosocial Re-Evaluation     Row Name 06/06/21 1715 07/07/21 1242           Psychosocial Re-Evaluation   Current issues with Current Stress Concerns Current Stress Concerns      Comments will review quality of life questionnaire in the upcoming sessions and offer support as needed. Reviewed quality of life. Eira continues to have stress of caring for her elderly mother. Claritza has the soppport of her siglings      Expected Outcomes Gayren's stressor's will be reduced or controlled upon completion of phase 2 cardiac rehab. Gayren's stressor's will be reduced or controlled upon completion of phase 2 cardiac rehab.      Interventions Encouraged to attend Cardiac  Rehabilitation for the exercise;Stress management education Encouraged to attend Cardiac Rehabilitation for the exercise;Stress management education      Continue Psychosocial Services  Follow up required by counselor Follow up required by counselor             Initial Review      Source of Stress Concerns Family Family      Comments Ouida continues to have stress from health concerns of her elderly mother. Abbygael continues to have stress from health concerns of her elderly mother.              Psychosocial Discharge (Final Psychosocial Re-Evaluation):  Psychosocial Re-Evaluation - 07/07/21 1242       Psychosocial Re-Evaluation   Current issues with Current Stress Concerns    Comments Reviewed quality of life. Adair continues to have stress of caring for her elderly mother. Wahneta has the soppport of her siglings    Expected Outcomes Gayren's stressor's will be reduced or controlled upon completion of phase 2 cardiac rehab.    Interventions Encouraged to attend Cardiac Rehabilitation for the exercise;Stress management education    Continue Psychosocial Services  Follow up required by counselor      Initial Review   Source of Stress Concerns Family    Comments Coraline continues to have stress from health concerns of her elderly mother.             Vocational Rehabilitation: Provide vocational rehab assistance to qualifying candidates.   Vocational Rehab Evaluation & Intervention:  Vocational Rehab - 05/31/21 1127       Initial Vocational Rehab Evaluation & Intervention   Assessment shows need for Vocational Rehabilitation No   Rhegan is retired and does not need vocational rehab at this time.            Education: Education Goals: Education classes will be provided on a weekly basis, covering required topics. Participant will state understanding/return demonstration of topics presented.  Learning Barriers/Preferences:  Learning Barriers/Preferences -  05/31/21 1516       Learning Barriers/Preferences   Learning Barriers Sight  Learning Preferences Skilled Demonstration;Pictoral;Video;Computer/Internet             Education Topics: Hypertension, Hypertension Reduction -Define heart disease and high blood pressure. Discus how high blood pressure affects the body and ways to reduce high blood pressure.   Exercise and Your Heart -Discuss why it is important to exercise, the FITT principles of exercise, normal and abnormal responses to exercise, and how to exercise safely.   Angina -Discuss definition of angina, causes of angina, treatment of angina, and how to decrease risk of having angina.   Cardiac Medications -Review what the following cardiac medications are used for, how they affect the body, and side effects that may occur when taking the medications.  Medications include Aspirin, Beta blockers, calcium channel blockers, ACE Inhibitors, angiotensin receptor blockers, diuretics, digoxin, and antihyperlipidemics.   Congestive Heart Failure -Discuss the definition of CHF, how to live with CHF, the signs and symptoms of CHF, and how keep track of weight and sodium intake.   Heart Disease and Intimacy -Discus the effect sexual activity has on the heart, how changes occur during intimacy as we age, and safety during sexual activity.   Smoking Cessation / COPD -Discuss different methods to quit smoking, the health benefits of quitting smoking, and the definition of COPD.   Nutrition I: Fats -Discuss the types of cholesterol, what cholesterol does to the heart, and how cholesterol levels can be controlled.   Nutrition II: Labels -Discuss the different components of food labels and how to read food label   Heart Parts/Heart Disease and PAD -Discuss the anatomy of the heart, the pathway of blood circulation through the heart, and these are affected by heart disease.   Stress I: Signs and Symptoms -Discuss the  causes of stress, how stress may lead to anxiety and depression, and ways to limit stress.   Stress II: Relaxation -Discuss different types of relaxation techniques to limit stress.   Warning Signs of Stroke / TIA -Discuss definition of a stroke, what the signs and symptoms are of a stroke, and how to identify when someone is having stroke.   Knowledge Questionnaire Score:  Knowledge Questionnaire Score - 05/31/21 1230       Knowledge Questionnaire Score   Pre Score 18/24             Core Components/Risk Factors/Patient Goals at Admission:  Personal Goals and Risk Factors at Admission - 05/31/21 1231       Core Components/Risk Factors/Patient Goals on Admission    Weight Management Yes;Obesity    Intervention Weight Management/Obesity: Establish reasonable short term and long term weight goals.;Obesity: Provide education and appropriate resources to help participant work on and attain dietary goals.    Admit Weight 190 lb 0.6 oz (86.2 kg)    Expected Outcomes Short Term: Continue to assess and modify interventions until short term weight is achieved;Long Term: Adherence to nutrition and physical activity/exercise program aimed toward attainment of established weight goal;Weight Loss: Understanding of general recommendations for a balanced deficit meal plan, which promotes 1-2 lb weight loss per week and includes a negative energy balance of [phone] kcal/d;Understanding recommendations for meals to include 15-35% energy as protein, 25-35% energy from fat, 35-60% energy from carbohydrates, less than  of dietary cholesterol, 20-35 gm of total fiber daily;Understanding of distribution of calorie intake throughout the day with the consumption of 4-5 meals/snacks    Diabetes Yes    Intervention Provide education about signs/symptoms and action to take for hypo/hyperglycemia.;Provide education about proper  nutrition, including hydration, and aerobic/resistive exercise prescription  along with prescribed medications to achieve blood glucose in normal ranges: Fasting glucose 65-99 mg/dL    Expected Outcomes Short Term: Participant verbalizes understanding of the signs/symptoms and immediate care of hyper/hypoglycemia, proper foot care and importance of medication, aerobic/resistive exercise and nutrition plan for blood glucose control.;Long Term: Attainment of HbA1C < 7%.    Hypertension Yes    Intervention Provide education on lifestyle modifcations including regular physical activity/exercise, weight management, moderate sodium restriction and increased consumption of fresh fruit, vegetables, and low fat dairy, alcohol moderation, and smoking cessation.;Monitor prescription use compliance.    Expected Outcomes Long Term: Maintenance of blood pressure at goal levels.;Short Term: Continued assessment and intervention until BP is < 140/44mm HG in hypertensive participants. < 130/73mm HG in hypertensive participants with diabetes, heart failure or chronic kidney disease.    Lipids Yes    Intervention Provide education and support for participant on nutrition & aerobic/resistive exercise along with prescribed medications to achieve LDL 70mg , HDL >40mg .    Expected Outcomes Short Term: Participant states understanding of desired cholesterol values and is compliant with medications prescribed. Participant is following exercise prescription and nutrition guidelines.;Long Term: Cholesterol controlled with medications as prescribed, with individualized exercise RX and with personalized nutrition plan. Value goals: LDL < 70mg , HDL > 40 mg.    Stress Yes    Intervention Offer individual and/or small group education and counseling on adjustment to heart disease, stress management and health-related lifestyle change. Teach and support self-help strategies.;Refer participants experiencing significant psychosocial distress to appropriate mental health specialists for further evaluation and  treatment. When possible, include family members and significant others in education/counseling sessions.    Expected Outcomes Short Term: Participant demonstrates changes in health-related behavior, relaxation and other stress management skills, ability to obtain effective social support, and compliance with psychotropic medications if prescribed.;Long Term: Emotional wellbeing is indicated by absence of clinically significant psychosocial distress or social isolation.             Core Components/Risk Factors/Patient Goals Review:   Goals and Risk Factor Review     Row Name 06/06/21 1720 07/07/21 1243           Core Components/Risk Factors/Patient Goals Review   Personal Goals Review Weight Management/Obesity;Stress;Hypertension;Lipids;Diabetes Weight Management/Obesity;Stress;Hypertension;Lipids;Diabetes      Review Aryahna started exercise on 06/06/21 and did well with exercise. post exercise CBG 78. patient asymptomatic. Repeat CBG 105 Glenna has been doing well with exercise. Gayren's vital signs have been stable.      Expected Outcomes Saavi will continue to participate in phase 2 exercise for exercise, nutrition and lifestyle modification Britaney will continue to participate in phase 2 exercise for exercise, nutrition and lifestyle modification               Core Components/Risk Factors/Patient Goals at Discharge (Final Review):   Goals and Risk Factor Review - 07/07/21 1243       Core Components/Risk Factors/Patient Goals Review   Personal Goals Review Weight Management/Obesity;Stress;Hypertension;Lipids;Diabetes    Review Tamiyah has been doing well with exercise. Gayren's vital signs have been stable.    Expected Outcomes Nakayla will continue to participate in phase 2 exercise for exercise, nutrition and lifestyle modification             ITP Comments:  ITP Comments     Row Name 05/31/21 1024 06/06/21 1713 07/07/21 1241       ITP Comments Medical  Director- Dr. 07/09/21,  MD 30 Day ITP Review. Montanna started exercise on 06/06/21 and did well with exercise. 30 Day ITP Review. Shanzay has good attendance and partcipation in phase 2 cardiac rehab              Comments: See ITP comments.Gladstone Lighter, RN,BSN 07/07/2021 12:48 PM

## 2021-07-08 ENCOUNTER — Encounter (HOSPITAL_COMMUNITY)
Admission: RE | Admit: 2021-07-08 | Discharge: 2021-07-08 | Disposition: A | Discharge status: Home / Self Care | Payer: No Typology Code available for payment source | Source: Ambulatory Visit | Attending: Cardiology | Admitting: Cardiology

## 2021-07-08 ENCOUNTER — Other Ambulatory Visit: Payer: Self-pay

## 2021-07-08 DIAGNOSIS — I213 ST elevation (STEMI) myocardial infarction of unspecified site: Secondary | ICD-10-CM

## 2021-07-08 DIAGNOSIS — Z5189 Encounter for other specified aftercare: Secondary | ICD-10-CM | POA: Diagnosis not present

## 2021-07-08 DIAGNOSIS — Z955 Presence of coronary angioplasty implant and graft: Secondary | ICD-10-CM

## 2021-07-11 ENCOUNTER — Other Ambulatory Visit: Payer: Self-pay

## 2021-07-11 ENCOUNTER — Encounter (HOSPITAL_COMMUNITY)
Admission: RE | Admit: 2021-07-11 | Discharge: 2021-07-11 | Disposition: A | Discharge status: Home / Self Care | Payer: No Typology Code available for payment source | Source: Ambulatory Visit | Attending: Cardiology | Admitting: Cardiology

## 2021-07-11 DIAGNOSIS — Z955 Presence of coronary angioplasty implant and graft: Secondary | ICD-10-CM

## 2021-07-11 DIAGNOSIS — Z5189 Encounter for other specified aftercare: Secondary | ICD-10-CM | POA: Diagnosis not present

## 2021-07-11 DIAGNOSIS — I213 ST elevation (STEMI) myocardial infarction of unspecified site: Secondary | ICD-10-CM

## 2021-07-13 ENCOUNTER — Encounter (HOSPITAL_COMMUNITY)
Admission: RE | Admit: 2021-07-13 | Discharge: 2021-07-13 | Disposition: A | Discharge status: Home / Self Care | Payer: No Typology Code available for payment source | Source: Ambulatory Visit | Attending: Cardiology | Admitting: Cardiology

## 2021-07-13 ENCOUNTER — Other Ambulatory Visit: Payer: Self-pay

## 2021-07-13 DIAGNOSIS — Z5189 Encounter for other specified aftercare: Secondary | ICD-10-CM | POA: Diagnosis not present

## 2021-07-13 DIAGNOSIS — Z955 Presence of coronary angioplasty implant and graft: Secondary | ICD-10-CM

## 2021-07-13 DIAGNOSIS — I213 ST elevation (STEMI) myocardial infarction of unspecified site: Secondary | ICD-10-CM

## 2021-07-15 ENCOUNTER — Other Ambulatory Visit: Payer: Self-pay

## 2021-07-15 ENCOUNTER — Encounter (HOSPITAL_COMMUNITY)
Admission: RE | Admit: 2021-07-15 | Discharge: 2021-07-15 | Disposition: A | Discharge status: Home / Self Care | Payer: No Typology Code available for payment source | Source: Ambulatory Visit | Attending: Cardiology | Admitting: Cardiology

## 2021-07-15 DIAGNOSIS — Z955 Presence of coronary angioplasty implant and graft: Secondary | ICD-10-CM

## 2021-07-15 DIAGNOSIS — Z5189 Encounter for other specified aftercare: Secondary | ICD-10-CM | POA: Diagnosis not present

## 2021-07-15 DIAGNOSIS — I213 ST elevation (STEMI) myocardial infarction of unspecified site: Secondary | ICD-10-CM

## 2021-07-18 ENCOUNTER — Other Ambulatory Visit: Payer: Self-pay

## 2021-07-18 ENCOUNTER — Encounter (HOSPITAL_COMMUNITY)
Admission: RE | Admit: 2021-07-18 | Discharge: 2021-07-18 | Disposition: A | Discharge status: Home / Self Care | Payer: No Typology Code available for payment source | Source: Ambulatory Visit | Attending: Cardiology | Admitting: Cardiology

## 2021-07-18 DIAGNOSIS — Z955 Presence of coronary angioplasty implant and graft: Secondary | ICD-10-CM | POA: Diagnosis not present

## 2021-07-18 DIAGNOSIS — I213 ST elevation (STEMI) myocardial infarction of unspecified site: Secondary | ICD-10-CM | POA: Diagnosis not present

## 2021-07-20 ENCOUNTER — Encounter (HOSPITAL_COMMUNITY)
Admission: RE | Admit: 2021-07-20 | Discharge: 2021-07-20 | Disposition: A | Discharge status: Home / Self Care | Payer: No Typology Code available for payment source | Source: Ambulatory Visit | Attending: Cardiology | Admitting: Cardiology

## 2021-07-20 ENCOUNTER — Other Ambulatory Visit: Payer: Self-pay

## 2021-07-20 DIAGNOSIS — I213 ST elevation (STEMI) myocardial infarction of unspecified site: Secondary | ICD-10-CM | POA: Diagnosis not present

## 2021-07-20 DIAGNOSIS — Z955 Presence of coronary angioplasty implant and graft: Secondary | ICD-10-CM

## 2021-07-22 ENCOUNTER — Encounter (HOSPITAL_COMMUNITY)
Admission: RE | Admit: 2021-07-22 | Discharge: 2021-07-22 | Disposition: A | Discharge status: Home / Self Care | Payer: No Typology Code available for payment source | Source: Ambulatory Visit | Attending: Cardiology | Admitting: Cardiology

## 2021-07-22 ENCOUNTER — Other Ambulatory Visit: Payer: Self-pay

## 2021-07-22 VITALS — Ht 66.5 in | Wt 191.1 lb

## 2021-07-22 DIAGNOSIS — I213 ST elevation (STEMI) myocardial infarction of unspecified site: Secondary | ICD-10-CM

## 2021-07-22 DIAGNOSIS — Z955 Presence of coronary angioplasty implant and graft: Secondary | ICD-10-CM

## 2021-07-25 ENCOUNTER — Encounter (HOSPITAL_COMMUNITY)
Admission: RE | Admit: 2021-07-25 | Discharge: 2021-07-25 | Disposition: A | Discharge status: Home / Self Care | Payer: No Typology Code available for payment source | Source: Ambulatory Visit | Attending: Cardiology | Admitting: Cardiology

## 2021-07-25 ENCOUNTER — Other Ambulatory Visit: Payer: Self-pay

## 2021-07-25 DIAGNOSIS — I213 ST elevation (STEMI) myocardial infarction of unspecified site: Secondary | ICD-10-CM | POA: Diagnosis not present

## 2021-07-25 DIAGNOSIS — Z955 Presence of coronary angioplasty implant and graft: Secondary | ICD-10-CM

## 2021-07-27 ENCOUNTER — Encounter (HOSPITAL_COMMUNITY)
Admission: RE | Admit: 2021-07-27 | Discharge: 2021-07-27 | Disposition: A | Discharge status: Home / Self Care | Payer: No Typology Code available for payment source | Source: Ambulatory Visit | Attending: Cardiology | Admitting: Cardiology

## 2021-07-27 ENCOUNTER — Other Ambulatory Visit: Payer: Self-pay

## 2021-07-27 DIAGNOSIS — I213 ST elevation (STEMI) myocardial infarction of unspecified site: Secondary | ICD-10-CM | POA: Diagnosis not present

## 2021-07-27 DIAGNOSIS — Z955 Presence of coronary angioplasty implant and graft: Secondary | ICD-10-CM

## 2021-07-29 ENCOUNTER — Encounter (HOSPITAL_COMMUNITY)
Admission: RE | Admit: 2021-07-29 | Discharge: 2021-07-29 | Disposition: A | Discharge status: Home / Self Care | Payer: No Typology Code available for payment source | Source: Ambulatory Visit | Attending: Cardiology | Admitting: Cardiology

## 2021-07-29 ENCOUNTER — Other Ambulatory Visit: Payer: Self-pay

## 2021-07-29 DIAGNOSIS — Z955 Presence of coronary angioplasty implant and graft: Secondary | ICD-10-CM

## 2021-07-29 DIAGNOSIS — I213 ST elevation (STEMI) myocardial infarction of unspecified site: Secondary | ICD-10-CM

## 2021-07-29 NOTE — Progress Notes (Signed)
Discharge Progress Report  Patient Details  Name: Taylor Frazier MRN: [id number] Date of Birth: October 17, 1956 Referring Provider:   Flowsheet Row CARDIAC REHAB PHASE II ORIENTATION from 05/31/2021 in [facility]  Referring Provider [place], [place], MD ([place])  Radford Pax, Eber Hong, MD (covering)]        Number of Visits: 23  Reason for Discharge:  Patient reached a stable level of exercise. Patient independent in their exercise. Patient has met program and personal goals.  Smoking History:  Social History   Tobacco Use  Smoking Status Never  Smokeless Tobacco Never    Diagnosis:  03/10/21 DES RCA  03/10/21 STEMI [facility]  ADL [facility]:   Initial Exercise Prescription:  Initial Exercise Prescription - 05/31/21 1200       Date of Initial Exercise RX and Referring Provider   Date 05/31/21    Referring Provider Carney Bern, [place] ([place])   Sueanne Margarita, MD (covering)   Expected Discharge Date 07/29/21      Treadmill   MPH 2.6    Grade 1    Minutes 15    METs 3.35      NuStep   Level 3    SPM 85    Minutes 15    METs 2.5      Prescription Details   Frequency (times per week) 3    Duration Progress to 30 minutes of continuous aerobic without signs/symptoms of physical distress      Intensity   THRR 40-80% of Max Heartrate 62-124    Ratings of Perceived Exertion 11-13    Perceived Dyspnea 0-4      Progression   Progression Continue to progress workloads to maintain intensity without signs/symptoms of physical distress.      Resistance Training   Training Prescription Yes    Weight 3 lbs    Reps 10-15             Discharge Exercise Prescription (Final Exercise Prescription Changes):  Exercise Prescription Changes - 07/29/21 1500       Response to Exercise   Blood Pressure (Admit) 118/72    Blood Pressure (Exercise) 148/72    Blood Pressure (Exit) 118/74    Heart Rate (Admit) 93 bpm    Heart Rate  (Exercise) 133 bpm    Heart Rate (Exit) 92 bpm    Rating of Perceived Exertion (Exercise) 10    Symptoms None    Comments Pt graduated from the CRP2 program today    Duration Continue with 30 min of aerobic exercise without signs/symptoms of physical distress.    Intensity THRR unchanged      Progression   Progression Continue to progress workloads to maintain intensity without signs/symptoms of physical distress.    Average METs 3.5      Resistance Training   Training Prescription Yes    Weight 4 lbs    Reps 10-15    Time 10 Minutes      Interval Training   Interval Training No      NuStep   Level 5    SPM 90    Minutes 15    METs 3.5      Track   Laps 15    Minutes 19    METs 3.2      Home Exercise Plan   Plans to continue exercise at Home (comment)    Frequency Add 2 additional days to program exercise sessions.    Initial Home Exercises  Provided 06/29/21             Functional Capacity:  6 Minute Walk     Row Name 05/31/21 1137 07/22/21 1614       6 Minute Walk   Phase Initial Discharge    Distance 1931 feet 1800 feet    Distance Feet Change -- -131 ft    Walk Time 6 minutes 6 minutes    # of Rest Breaks 0 0    MPH 3.66 3.41    METS 4.39 3.95    RPE 11 10    Perceived Dyspnea  0 1    VO2 Peak 15.35 13.82    Symptoms No No    Resting HR 77 bpm 91 bpm    Resting BP 112/68 96/62    Resting Oxygen Saturation  97 % 97 %    Exercise Oxygen Saturation  during 6 min walk 98 % 98 %    Max Ex. HR 117 bpm 101 bpm    Max Ex. BP 144/72 140/70    2 Minute Post BP 128/84 --             Psychological, QOL, Others - Outcomes: PHQ 2/9: Depression screen [facility] 2/9 08/11/2021 05/31/2021  Decreased Interest 0 0  Down, Depressed, Hopeless 0 0  PHQ - 2 Score 0 0    Quality of Life:  Quality of Life - 07/29/21 1500       Quality of Life Scores   Health/Function Pre 18.67 %    Health/Function Post 22.75 %    Health/Function % Change 21.85 %     Socioeconomic Pre 16.21 %    Socioeconomic Post 17 %    Socioeconomic % Change  4.87 %    Psych/Spiritual Pre 15.5 %    Psych/Spiritual Post 20.57 %    Psych/Spiritual % Change 32.71 %    Family Pre 22.5 %    Family Post 24.13 %    Family % Change 7.24 %    GLOBAL Pre 18.07 %    GLOBAL Post 21.19 %    GLOBAL % Change 17.27 %             Personal Goals: Goals established at orientation with interventions provided to work toward goal.  Personal Goals and Risk Factors at Admission - 05/31/21 1231       Core Components/Risk Factors/Patient Goals on Admission    Weight Management Yes;Obesity    Intervention Weight Management/Obesity: Establish reasonable short term and long term weight goals.;Obesity: Provide education and appropriate resources to help participant work on and attain dietary goals.    Admit Weight 190 lb 0.6 oz (86.2 kg)    Expected Outcomes Short Term: Continue to assess and modify interventions until short term weight is achieved;Long Term: Adherence to nutrition and physical activity/exercise program aimed toward attainment of established weight goal;Weight Loss: Understanding of general recommendations for a balanced deficit meal plan, which promotes 1-2 lb weight loss per week and includes a negative energy balance of [phone] kcal/d;Understanding recommendations for meals to include 15-35% energy as protein, 25-35% energy from fat, 35-60% energy from carbohydrates, less than 284m of dietary cholesterol, 20-35 gm of total fiber daily;Understanding of distribution of calorie intake throughout the day with the consumption of 4-5 meals/snacks    Diabetes Yes    Intervention Provide education about signs/symptoms and action to take for hypo/hyperglycemia.;Provide education about proper nutrition, including hydration, and aerobic/resistive exercise prescription along with prescribed medications to achieve blood glucose  in normal ranges: Fasting glucose 65-99 mg/dL     Expected Outcomes Short Term: Participant verbalizes understanding of the signs/symptoms and immediate care of hyper/hypoglycemia, proper foot care and importance of medication, aerobic/resistive exercise and nutrition plan for blood glucose control.;Long Term: Attainment of HbA1C < 7%.    Hypertension Yes    Intervention Provide education on lifestyle modifcations including regular physical activity/exercise, weight management, moderate sodium restriction and increased consumption of fresh fruit, vegetables, and low fat dairy, alcohol moderation, and smoking cessation.;Monitor prescription use compliance.    Expected Outcomes Long Term: Maintenance of blood pressure at goal levels.;Short Term: Continued assessment and intervention until BP is < 140/63m HG in hypertensive participants. < 130/857mHG in hypertensive participants with diabetes, heart failure or chronic kidney disease.    Lipids Yes    Intervention Provide education and support for participant on nutrition & aerobic/resistive exercise along with prescribed medications to achieve LDL <7032mHDL >32m60m  Expected Outcomes Short Term: Participant states understanding of desired cholesterol values and is compliant with medications prescribed. Participant is following exercise prescription and nutrition guidelines.;Long Term: Cholesterol controlled with medications as prescribed, with individualized exercise RX and with personalized nutrition plan. Value goals: LDL < 70mg56mL > 40 mg.    Stress Yes    Intervention Offer individual and/or small group education and counseling on adjustment to heart disease, stress management and health-related lifestyle change. Teach and support self-help strategies.;Refer participants experiencing significant psychosocial distress to appropriate mental health specialists for further evaluation and treatment. When possible, include family members and significant others in education/counseling sessions.    Expected  Outcomes Short Term: Participant demonstrates changes in health-related behavior, relaxation and other stress management skills, ability to obtain effective social support, and compliance with psychotropic medications if prescribed.;Long Term: Emotional wellbeing is indicated by absence of clinically significant psychosocial distress or social isolation.              Personal Goals Discharge:  Goals and Risk Factor Review     Row Name 06/06/21 1720 07/07/21 1243 08/11/21 0835         Core Components/Risk Factors/Patient Goals Review   Personal Goals Review Weight Management/Obesity;Stress;Hypertension;Lipids;Diabetes Weight Management/Obesity;Stress;Hypertension;Lipids;Diabetes Weight Management/Obesity;Stress;Hypertension;Lipids;Diabetes     Review Taylor Frazier started exercise on 06/06/21 and did well with exercise. post exercise CBG 78. patient asymptomatic. Repeat CBG 105 Taylor Frazier has been doing well with exercise. Taylor Frazier's vital signs have been stable. Taylor Frazier doing well with exercise. Taylor Frazier's vital signs have been stable. Taylor Frazier completed cardiac rehab on 07/29/21     Expected Outcomes Taylor Frazier will continue to participate in phase 2 exercise for exercise, nutrition and lifestyle modification Taylor Frazier will continue to participate in phase 2 exercise for exercise, nutrition and lifestyle modification Taylor Frazier will continue to  exercise, nutrition and lifestyle modification upon completion of phase 2 cardiac rehab.              Exercise Goals and Review:  Exercise Goals     Row Name 05/31/21 1043             Exercise Goals   Increase Physical Activity Yes       Intervention Provide advice, education, support and counseling about physical activity/exercise needs.;Develop an individualized exercise prescription for aerobic and resistive training based on initial evaluation findings, risk stratification, comorbidities and participant's personal goals.       Expected Outcomes  Short Term: Attend rehab on a regular basis to increase amount of physical activity.;Long Term: Exercising  regularly at least 3-5 days a week.;Long Term: Add in home exercise to make exercise part of routine and to increase amount of physical activity.       Increase Strength and Stamina Yes       Intervention Provide advice, education, support and counseling about physical activity/exercise needs.;Develop an individualized exercise prescription for aerobic and resistive training based on initial evaluation findings, risk stratification, comorbidities and participant's personal goals.       Expected Outcomes Short Term: Increase workloads from initial exercise prescription for resistance, speed, and METs.;Short Term: Perform resistance training exercises routinely during rehab and add in resistance training at home;Long Term: Improve cardiorespiratory fitness, muscular endurance and strength as measured by increased METs and functional capacity (6MWT)       Able to understand and use rate of perceived exertion (RPE) scale Yes       Intervention Provide education and explanation on how to use RPE scale       Expected Outcomes Short Term: Able to use RPE daily in rehab to express subjective intensity level;Long Term:  Able to use RPE to guide intensity level when exercising independently       Knowledge and understanding of Target Heart Rate Range (THRR) Yes       Intervention Provide education and explanation of THRR including how the numbers were predicted and where they are located for reference       Expected Outcomes Short Term: Able to state/look up THRR;Long Term: Able to use THRR to govern intensity when exercising independently;Short Term: Able to use daily as guideline for intensity in rehab       Able to check pulse independently Yes       Intervention Provide education and demonstration on how to check pulse in carotid and radial arteries.;Review the importance of being able to check your own  pulse for safety during independent exercise       Expected Outcomes Short Term: Able to explain why pulse checking is important during independent exercise;Long Term: Able to check pulse independently and accurately       Understanding of Exercise Prescription Yes       Intervention Provide education, explanation, and written materials on patient's individual exercise prescription       Expected Outcomes Short Term: Able to explain program exercise prescription;Long Term: Able to explain home exercise prescription to exercise independently                Exercise Goals Re-Evaluation:  Exercise Goals Re-Evaluation     Row Name 06/06/21 1417 06/29/21 1500 07/29/21 1500         Exercise Goal Re-Evaluation   Exercise Goals Review Increase Physical Activity;Able to understand and use rate of perceived exertion (RPE) scale Increase Physical Activity;Increase Strength and Stamina;Able to understand and use rate of perceived exertion (RPE) scale;Knowledge and understanding of Target Heart Rate Range (THRR);Able to check pulse independently;Understanding of Exercise Prescription Increase Physical Activity;Increase Strength and Stamina;Able to understand and use rate of perceived exertion (RPE) scale;Knowledge and understanding of Target Heart Rate Range (THRR);Able to check pulse independently;Understanding of Exercise Prescription     Comments Patient able to understand and use RPE scale appropriately. Reviewed home exercise Rx. Pt will walk at 3-3x/week for 30-45 minutes Pt graduated rom the CRP2 porgram today. Pt progressed well in the program and had peak MET level of 4.4 on the Nustep. Pt voices feeling stronger and is motivated to continue her exercise program. Pt will continue her exercise  by walking at home and is considering joining the [facility] near her home.     Expected Outcomes Progress workloads as tolerated to help improve cardiorespiratory fitness. Pt will exercise at home on her own in  addtion to the CRP2 program. Pt will exercise at home on her own.              Nutrition & Weight - Outcomes:  Pre Biometrics - 05/31/21 1024       Pre Biometrics   Waist Circumference 42 inches    Hip Circumference 43.25 inches    Waist to Hip Ratio 0.97 %    Triceps Skinfold 25 mm    % Body Fat 41.6 %    Grip Strength 41.5 kg    Flexibility 11.75 in    Single Leg Stand 30 seconds             Post Biometrics - 07/22/21 1616        Post  Biometrics   Height 5' 6.5" (1.689 m)    Weight 86.7 kg    Waist Circumference 42 inches    Hip Circumference 43.25 inches    Waist to Hip Ratio 0.97 %    BMI (Calculated) 30.39    Triceps Skinfold 25 mm    % Body Fat 41.7 %    Grip Strength 35 kg    Flexibility 14 in    Single Leg Stand 30 seconds             Nutrition:  Nutrition Therapy & Goals - 06/08/21 1435       Nutrition Therapy   Diet TLC    Drug/Food Interactions Statins/Certain Fruits      Personal Nutrition Goals   Nutrition Goal Pt to build a healthy plate including vegetables, fruits, whole grains, and low-fat dairy products in a heart healthy meal plan.    Personal Goal #2 Pt to incorporate 25-30 g fiber; 5-10 g soluble fiber    Personal Goal #3 Pt to reduce fried foods      Intervention Plan   Intervention Prescribe, educate and counsel regarding individualized specific dietary modifications aiming towards targeted core components such as weight, hypertension, lipid management, diabetes, heart failure and other comorbidities.;Nutrition handout(s) given to patient.    Expected Outcomes Short Term Goal: Understand basic principles of dietary content, such as calories, fat, sodium, cholesterol and nutrients.;Long Term Goal: Adherence to prescribed nutrition plan.             Nutrition Discharge:   Education Questionnaire Score:  Knowledge Questionnaire Score - 05/31/21 1230       Knowledge Questionnaire Score   Pre Score 18/24              Goals reviewed with patient; copy given to patient.Taylor Frazier graduated from cardiac rehab program on 07/29/21 with completion of 23 exercise sessions in Phase II. Pt maintained good attendance and progressed nicely during his participation in rehab as evidenced by increased MET level.   Medication list reconciled. Repeat  PHQ score-  0.  Pt has made significant lifestyle changes and should be commended for her success. Pt feels she has achieved her goals during cardiac rehab.   Pt plans to continue exercise by walking and may consider joining her local [employer] we are proud of Taylor Frazier's progress!Harrell Gave RN BSN

## 2022-05-09 ENCOUNTER — Emergency Department (HOSPITAL_COMMUNITY): Payer: No Typology Code available for payment source

## 2022-05-09 ENCOUNTER — Other Ambulatory Visit: Payer: Self-pay

## 2022-05-09 ENCOUNTER — Emergency Department (HOSPITAL_COMMUNITY)
Admission: EM | Admit: 2022-05-09 | Discharge: 2022-05-09 | Disposition: A | Discharge status: Home / Self Care | Payer: No Typology Code available for payment source | Attending: Emergency Medicine | Admitting: Emergency Medicine

## 2022-05-09 ENCOUNTER — Encounter (HOSPITAL_COMMUNITY): Payer: Self-pay | Admitting: Student

## 2022-05-09 DIAGNOSIS — I252 Old myocardial infarction: Secondary | ICD-10-CM | POA: Diagnosis not present

## 2022-05-09 DIAGNOSIS — I2510 Atherosclerotic heart disease of native coronary artery without angina pectoris: Secondary | ICD-10-CM | POA: Insufficient documentation

## 2022-05-09 DIAGNOSIS — R0789 Other chest pain: Secondary | ICD-10-CM | POA: Diagnosis present

## 2022-05-09 DIAGNOSIS — E119 Type 2 diabetes mellitus without complications: Secondary | ICD-10-CM | POA: Diagnosis not present

## 2022-05-09 DIAGNOSIS — E1159 Type 2 diabetes mellitus with other circulatory complications: Secondary | ICD-10-CM

## 2022-05-09 DIAGNOSIS — Z7984 Long term (current) use of oral hypoglycemic drugs: Secondary | ICD-10-CM | POA: Insufficient documentation

## 2022-05-09 DIAGNOSIS — I10 Essential (primary) hypertension: Secondary | ICD-10-CM

## 2022-05-09 DIAGNOSIS — I25118 Atherosclerotic heart disease of native coronary artery with other forms of angina pectoris: Secondary | ICD-10-CM | POA: Diagnosis not present

## 2022-05-09 DIAGNOSIS — Z79899 Other long term (current) drug therapy: Secondary | ICD-10-CM | POA: Diagnosis not present

## 2022-05-09 DIAGNOSIS — R072 Precordial pain: Secondary | ICD-10-CM

## 2022-05-09 DIAGNOSIS — Z7982 Long term (current) use of aspirin: Secondary | ICD-10-CM | POA: Diagnosis not present

## 2022-05-09 DIAGNOSIS — E782 Mixed hyperlipidemia: Secondary | ICD-10-CM | POA: Diagnosis not present

## 2022-05-09 LAB — CBC WITH DIFFERENTIAL/PLATELET
Abs Immature Granulocytes: 0.02 10*3/uL (ref 0.00–0.07)
Basophils Absolute: 0 10*3/uL (ref 0.0–0.1)
Basophils Relative: 1 %
Eosinophils Absolute: 0.2 10*3/uL (ref 0.0–0.5)
Eosinophils Relative: 3 %
HCT: 41.9 % (ref 36.0–46.0)
Hemoglobin: 13.1 g/dL (ref 12.0–15.0)
Immature Granulocytes: 0 %
Lymphocytes Relative: 19 %
Lymphs Abs: 1.1 10*3/uL (ref 0.7–4.0)
MCH: 27.6 pg (ref 26.0–34.0)
MCHC: 31.3 g/dL (ref 30.0–36.0)
MCV: 88.2 fL (ref 80.0–100.0)
Monocytes Absolute: 0.7 10*3/uL (ref 0.1–1.0)
Monocytes Relative: 12 %
Neutro Abs: 3.8 10*3/uL (ref 1.7–7.7)
Neutrophils Relative %: 65 %
Platelets: 168 10*3/uL (ref 150–400)
RBC: 4.75 MIL/uL (ref 3.87–5.11)
RDW: 14 % (ref 11.5–15.5)
WBC: 5.8 10*3/uL (ref 4.0–10.5)
nRBC: 0 % (ref 0.0–0.2)

## 2022-05-09 LAB — COMPREHENSIVE METABOLIC PANEL
ALT: 31 U/L (ref 0–44)
AST: 26 U/L (ref 15–41)
Albumin: 3.7 g/dL (ref 3.5–5.0)
Alkaline Phosphatase: 68 U/L (ref 38–126)
Anion gap: 9 (ref 5–15)
BUN: 11 mg/dL (ref 8–23)
CO2: 23 mmol/L (ref 22–32)
Calcium: 9.4 mg/dL (ref 8.9–10.3)
Chloride: 106 mmol/L (ref 98–111)
Creatinine, Ser: 0.63 mg/dL (ref 0.44–1.00)
GFR, Estimated: >60 mL/min (ref >60)
Glucose, Bld: 151 mg/dL — ABNORMAL HIGH (ref 70–99)
Potassium: 3.7 mmol/L (ref 3.5–5.1)
Sodium: 138 mmol/L (ref 135–145)
Total Bilirubin: 0.8 mg/dL (ref 0.3–1.2)
Total Protein: 6.8 g/dL (ref 6.5–8.1)

## 2022-05-09 LAB — CBG MONITORING, ED: Glucose-Capillary: 85 mg/dL (ref 70–99)

## 2022-05-09 LAB — D-DIMER, QUANTITATIVE: D-Dimer, Quant: 0.53 ug/mL-FEU — ABNORMAL HIGH (ref 0.00–0.50)

## 2022-05-09 LAB — TROPONIN I (HIGH SENSITIVITY)
Troponin I (High Sensitivity): 6 ng/L (ref <18)
Troponin I (High Sensitivity): 6 ng/L (ref <18)

## 2022-05-09 MED ORDER — FENTANYL CITRATE PF 50 MCG/ML IJ SOSY
50.0000 ug | PREFILLED_SYRINGE | Freq: Once | INTRAMUSCULAR | Status: AC
  Administered 2022-05-09: 50 ug via INTRAVENOUS
  Filled 2022-05-09: qty 1

## 2022-05-09 MED ORDER — ASPIRIN 81 MG PO CHEW
324.0000 mg | CHEWABLE_TABLET | Freq: Once | ORAL | Status: AC
  Administered 2022-05-09: 324 mg via ORAL
  Filled 2022-05-09: qty 4

## 2022-05-09 NOTE — Consult Note (Addendum)
Cardiology Consultation:   Patient ID: Taylor GasmanGayrene Frazier MRN: [id number]; DOB: [birth year]  Admit date: 05/09/2022 Date of Consult: 05/09/2022  PCP:  Oneita HurtPcp, No   [facility] Providers Cardiologist:  None      [facility]  Patient Profile:   Taylor Frazier is a 66 y.o. female with a hx of inf wall MI 2022, PCI to distal RCA -cardiogenic shock at that time, HTN, HLD, DM  who is being seen 05/09/2022 for the evaluation of chest pain at the request of Dr. Madilyn Hookees.  History of Present Illness:   Taylor Frazier with above hx hand cardiology hx of MI (inferior wall), late presenting with cardiogenic shock in 02/2021 undergoing PCI to distal RCA. Original cath 03/11/21 with significant thrombus of mid to distal RCA.  Revascularization with multiple runs of aspiration thrombectomy, IC tirofiban and multiple inflations with 3.0 and 3.5 mm compliant balloons with restoration of TIMI II-III flow in RPDA and RPL system with significant residual thrombotic debris noted in RPL system. No stent placed at that time due to residual thrombotic debris. Non obstructive residual disease in OM1, mLAD.  Follow up cath on the 28th  with DES placed.   Echo 03/10/21 with EF 50% and inferolateral hypokinesis, mild concentric LVH and mild 1+ MR.   Hx of renal artery u/s within normal limits.   She was seen yesterday at walk in clinic for cold symptoms Dx URI.    Pt presents today to ER with left chest pain, mild SOB that began last pm. At times difficulty sleeping She took 3 NTG with not much change symptoms. This pain different than with MI 02/2021.  In ER given fentanyl and pain resolved.   No nausea no diaphoresis.  Normally does some exercise in her home watching U tube - no pain with that or house work.    She stopped her effient 2-3 weeks ago.     EKG:  The EKG was personally reviewed and demonstrates:  ST at 102 with T wave inversions III, AVF sounding similar to old EKGs also incomplete  RBBB Telemetry:  Telemetry was personally reviewed and demonstrates:  SR Na 138, K+ 3.7, BUN 11 Cr 0.63 LFTS WNL Hs troponin 6, 6 Hgb 13.1 plts 168 Ddimer 0.53  2V CXR mild atelectasis at lung base.   BP on arrival 155/91 now 135/83 P 70s to 80s R 18 -20  afebrile sp02 on RA 94%  Past Medical History:  Diagnosis Date   Coronary artery disease 02/10/2021   [facility]   Diabetes mellitus without complication (HCC)    Hyperlipidemia    Hypertension     Past Surgical History:  Procedure Laterality Date   CARDIAC CATHETERIZATION       Home Medications:  Prior to Admission medications   Medication Sig Start Date End Date Taking? Authorizing Provider  aspirin 81 MG chewable tablet Chew 81 mg by mouth daily.    [provider]  atorvastatin (LIPITOR) 80 MG tablet Take 80 mg by mouth daily.    [provider]  Cholecalciferol 25 MCG (1000 UT) tablet Take 1,000 Units by mouth daily.    [provider]  metFORMIN (GLUCOPHAGE) 500 MG tablet Take 500 mg by mouth 2 (two) times daily with a meal.    [provider]  metoprolol succinate (TOPROL-XL) 25 MG 24 hr tablet Take 12.5 mg by mouth daily.    [provider]  Multiple Vitamin (MULTIVITAMIN WITH MINERALS) TABS tablet Take 1 tablet by mouth daily.    [provider]  nitroGLYCERIN (NITROSTAT) 0.4 MG SL tablet Place 0.4 mg under the tongue every 5 (five) minutes as needed for chest pain.    [provider]  prasugrel (EFFIENT) 10 MG TABS tablet Take 10 mg by mouth daily.    [provider]    Inpatient Medications: Scheduled Meds:  Continuous Infusions:  PRN Meds:   Allergies:   No Known Allergies  Social History:   Social History   Socioeconomic History   Marital status: Unknown    Spouse name: Not on file   Number of children: 3   Years of education: 12   Highest education level: High school  graduate  Occupational History   Occupation: Retired  Tobacco Use   Smoking status: Never   Smokeless tobacco: Never  Building services engineer Use: Never used  Substance and Sexual Activity   Alcohol use: Not Currently   Drug use: Never   Sexual activity: Not Currently  Other Topics Concern   Not on file  Social History Narrative   Not on file   Social Determinants of Health   Financial Resource Strain: Not on file  Food Insecurity: Not on file  Transportation Needs: Not on file  Physical Activity: Not on file  Stress: Not on file  Social Connections: Not on file  Intimate Partner Violence: Not on file    Family History:    Family History  Problem Relation Age of Onset   Heart disease Brother    Heart attack Brother      ROS:  Please see the history of present illness.  General:no colds or fevers, no weight changes Skin:no rashes or ulcers HEENT:no blurred vision, no congestion CV:see HPI PUL:see HPI GI:no diarrhea constipation or melena, no indigestion GU:no hematuria, no dysuria MS:no joint pain, no claudication Neuro:no syncope, no lightheadedness Endo:+ diabetes, no thyroid disease  All other ROS reviewed and negative.     Physical Exam/Data:   Vitals:   05/09/22 1000 05/09/22 1030 05/09/22 1132 05/09/22 1300  BP: 134/81 (!) 160/91 (!) 149/80 135/83  Pulse: 83 84 85 78  Resp: 16 18 20  (!) 24  Temp:      SpO2: 96% 98% 96% 100%   No intake or output data in the 24 hours ending 05/09/22 1341    07/22/2021    4:16 PM 05/31/2021   10:24 AM  Last 3 Weights  Weight (lbs) 191 lb 2.2 oz 190 lb 0.6 oz  Weight (kg) 86.7 kg 86.2 kg     There is no height or weight on file to calculate BMI.  General:  Well nourished, well developed, in no acute distress and pain free now  HEENT: normal Neck: no JVD Vascular: No carotid bruits; Distal pulses 2+ bilaterally Cardiac:  normal S1, S2; RRR; no murmur gallup rub or click Lungs:  clear to auscultation bilaterally, no  wheezing, rhonchi or rales  Abd: soft, nontender, no hepatomegaly  Ext: no edema Musculoskeletal:  No deformities, BUE and BLE strength normal and equal Skin: warm and dry  Neuro: alert and oriented X 3 MAE , follows commands no focal abnormalities noted Psych:  Normal affect    Relevant CV Studies: 03/14/21 cardiac cath  Patient underwent LHC via right radial approach.  LHC with improved flow in distal RCA but still with significant thrombotic debris.  IVUS with severe plaque in distal RCA, likely site  of plaque rupture with distal embolization and thrombus formation.   Status post successful PCI and revascularization with placement of one 4.0 x 32 mm Synergy DES, postdilated with a 5.0 mm [place] balloon.  Final angiogram with well apposed stent, no evidence of edge dissection or perforation and TIMI-3 flow in distal vessel. RPL1 still with residual thrombotic emboli, but with TIMI II-TIMI-3 flow, attempted PTCA with balloon angioplasty only with still significant debris-to be managed medically at this time.  Recommendations:  Continue DAPT with aspirin and Effient.  We will continue IV tirofiban for total course of 6 hours.  We will potentially consider discharging tomorrow on Effient and Eliquis for 1 month given significant thrombotic debris and then resume aspirin and Effient.  Discontinue IV heparin infusion.  TR band deflation protocol.  LHC 03/11/2021: Conclusion  1. Acute thrombotic occlusion of mid to distal RCA with significant thrombotic burden noted, culprit for ACS.  Successful PCI and revascularization with multiple runs of aspiration thrombectomy, IC tirofiban and multiple inflations with 3.0 and 3.5 mm compliant balloons with restoration of TIMI II-III flow in RPDA and RPL system with significant residual thrombotic debris noted in RPL system.   IVUS interrogation with significant thrombotic debris noted.  Stent not placed given significant residual thrombotic  debris.  Patient without any chest pain and no evidence of hemodynamic or electrical instability at conclusion of case. 2. Moderate (60%) eccentric discrete lesion in proximal OM1 with diffuse moderate disease distally. 3. Mild (30%) eccentric diffuse lesion in mid LAD. Robust collaterals from septal network of LAD to RPDA and RPL system noted. 4. Moderate to severely elevated LVEDP, 28 mmHg. 5. No gradient across aortic valve on LV-AO catheter pullback. 6. TR band to right wrist.  ECHO 03/10/2021: IMPRESSIONS:   1. Normal LV / RV size and systolic function. LVEF 50% range. Inferolateral hypokinesis. Mild concentric LVH.  2. Mild 1+ MR.    Laboratory Data:  High Sensitivity Troponin:   Recent Labs  Lab 05/09/22 0414 05/09/22 0602  TROPONINIHS 6 6     Chemistry Recent Labs  Lab 05/09/22 0414  NA 138  K 3.7  CL 106  CO2 23  GLUCOSE 151*  BUN 11  CREATININE 0.63  CALCIUM 9.4  GFRNONAA >60  ANIONGAP 9    Recent Labs  Lab 05/09/22 0414  PROT 6.8  ALBUMIN 3.7  AST 26  ALT 31  ALKPHOS 68  BILITOT 0.8   Lipids No results for input(s): CHOL, TRIG, HDL, LABVLDL, LDLCALC, CHOLHDL in the last 168 hours.  Hematology Recent Labs  Lab 05/09/22 0414  WBC 5.8  RBC 4.75  HGB 13.1  HCT 41.9  MCV 88.2  MCH 27.6  MCHC 31.3  RDW 14.0  PLT 168   Thyroid No results for input(s): TSH, FREET4 in the last 168 hours.  BNPNo results for input(s): BNP, PROBNP in the last 168 hours.  DDimer  Recent Labs  Lab 05/09/22 0602  DDIMER 0.53*     Radiology/Studies:  DG Chest 2 View  Result Date: 05/09/2022 CLINICAL DATA:  Chest pain and shortness of breath. EXAM: CHEST - 2 VIEW COMPARISON:  01/04/2008 FINDINGS: The heart size and mediastinal contours are within normal limits. Mild atelectasis is noted at the lung bases. No consolidation, effusion, or pneumothorax. Degenerative changes are present in thoracic spine. IMPRESSION: Mild atelectasis at the lung bases. Electronically  Signed   By: Thornell Sartorius M.D.   On: 05/09/2022 04:27     Assessment and Plan:  Chest pain in pt with acute MI last year with thrombus RCA and eventual stent to distal RCs  EKG without acute changes but no old to compare,  neg Troponin.  She is on ASA at home and BB and statin.  She stopped Effient 10 mg 2-3 weeks ago per cardiology instructions.  Last echo with EF 50%.  While no longer in pain it is reasonable to discharge and follow up for stress test with her cardiologist at [facility].  Ok for discharge will add effient back for now.  CAD see above HTN elevated on arrival may need higher dose of BB HLD on lipitor 80 mg I do not see any recent lipids in Epic.  DM-2 on metformin. If admitted will hold and use SSI for coverage.    Risk Assessment/Risk Scores:     HEAR Score (for undifferentiated chest pain):  HEAR Score: 6          For questions or updates, please contact [facility] Please consult [email or web address] for contact info under    Signed, Nada Boozer, NP  05/09/2022 1:41 PM   I have examined the patient and reviewed assessment and plan and discussed with patient.  Agree with above as stated.    Prior acute coronary syndrome and RCA stent done in [place].  Currently pain free.  Symptoms that brought her to the ER are different from what she had back in 2022.  Troponins negative today.  Interestingly, she stopped her Effient several weeks back.Given that she had a large thrombus burden described on her cath report, I think Effient may be a better option than aspirin for her.  Will restart her Effient 10 mg daily.  After about a week of taking both aspirin and Effient, she can stop the aspirin.  I explained this to the patient and she understood.  She will also follow-up with her cardiologist at the [place].  Stress testing could be considered.Given negative high-sensitivity troponins and benign ECG, I do not think inpatient ischemic evaluation is required.  The patient prefers to go  home as well.  She is feeling much better.     Lance Muss

## 2022-05-09 NOTE — ED Triage Notes (Signed)
Patient reports intermittent left chest pain with mild SOB onset last night , no emesis or diaphoresis ,  History of CAD.

## 2022-05-09 NOTE — ED Provider Triage Note (Signed)
Emergency Medicine Provider Triage Evaluation Note  Taylor Frazier , a 66 y.o. female  was evaluated in triage.  Pt complains of chest pain since 23:30. Pain is intermittent, central pressure, no alleviating/aggravating factors. Tried nitroglycerin without relief. Hx of CAD w/ stent placed 1.5 years prior. Took aspirin tonight.   Review of Systems  Positive: Chest pain Negative: Nausea, vomiting, diaphoresis, dyspnea, syncope, hemoptysis, leg swelling  Physical Exam  BP (!) 157/93 (BP Location: Right Arm)   Pulse (!) 109   Temp 98.5 F (36.9 C)   Resp 18   SpO2 94%  Gen:   Awake, no distress   Resp:  Normal effort  MSK:   Moves extremities without difficulty   Medical Decision Making  Medically screening exam initiated at 4:01 AM.  Appropriate orders placed.  Bob Eastwood was informed that the remainder of the evaluation will be completed by another provider, this initial triage assessment does not replace that evaluation, and the importance of remaining in the ED until their evaluation is complete.  Chest pain   Cherly Anderson, PA-C 05/09/22 0370

## 2022-05-09 NOTE — ED Notes (Signed)
Pt ambulatory to waiting room. Pt verbalized understanding of discharge instructions.   

## 2022-05-09 NOTE — ED Provider Notes (Signed)
[facility] EMERGENCY DEPARTMENT Provider Note   CSN: [id number] Arrival date & time: 05/09/22  0349     History  Chief Complaint  Patient presents with   Chest Pain    Hx. CAD    Taylor Frazier is a 66 y.o. female.  The history is provided by the patient and medical records.  Chest Pain Taylor Frazier is a 66 y.o. female who presents to the Emergency Department complaining of chest pain.  She presents to the ED for evaluation of left sided chest pain that started at 1130pm.  Pain is a constant, nonradiating pressure.  Pain started at rest.  Took three NTG without improvement in sxs.  Felt tired yesterday.  Pain today is different than prior heart attack.   No fever, diaphoresis, sob.  Has cough since Friday. No AP, N/V.  No leg swelling.    Has a hx/o DM, CAD s/p stent 18 months ago at [place], HTN.  Takes 81mg  ASA daily.       Home Medications Prior to Admission medications   Medication Sig Start Date End Date Taking? Authorizing Provider  aspirin 81 MG chewable tablet Chew 81 mg by mouth daily.    [provider]  atorvastatin (LIPITOR) 80 MG tablet Take 80 mg by mouth daily.    [provider]  Cholecalciferol 25 MCG (1000 UT) tablet Take 1,000 Units by mouth daily.    [provider]  metFORMIN (GLUCOPHAGE) 500 MG tablet Take 500 mg by mouth 2 (two) times daily with a meal.    [provider]  metoprolol succinate (TOPROL-XL) 25 MG 24 hr tablet Take 12.5 mg by mouth daily.    [provider]  Multiple Vitamin (MULTIVITAMIN WITH MINERALS) TABS tablet Take 1 tablet by mouth daily.    [provider]  nitroGLYCERIN (NITROSTAT) 0.4 MG SL tablet Place 0.4 mg under the tongue every 5 (five) minutes as needed for chest pain.    [provider]  prasugrel (EFFIENT) 10 MG TABS tablet Take 10 mg by mouth daily.    [provider]      Allergies    Patient has no known allergies.    Review  of Systems   Review of Systems  Cardiovascular:  Positive for chest pain.  All other systems reviewed and are negative.  Physical Exam Updated Vital Signs BP (!) 166/90   Pulse 95   Temp 98.5 F (36.9 C)   Resp 19   SpO2 97%  Physical Exam Vitals and nursing note reviewed.  Constitutional:      Appearance: She is well-developed.  HENT:     Head: Normocephalic and atraumatic.  Cardiovascular:     Rate and Rhythm: Normal rate and regular rhythm.     Heart sounds: No murmur heard. Pulmonary:     Effort: Pulmonary effort is normal. No respiratory distress.     Breath sounds: Normal breath sounds.  Abdominal:     Palpations: Abdomen is soft.     Tenderness: There is no abdominal tenderness. There is no guarding or rebound.  Musculoskeletal:        General: No tenderness.     Comments: Trace edema to BLE  Skin:    General: Skin is warm and dry.  Neurological:     Mental Status: She is alert and oriented to person, place, and time.  Psychiatric:        Behavior: Behavior normal.    ED Results / Procedures / Treatments  Labs (all labs ordered are listed, but only abnormal results are displayed) Labs Reviewed  COMPREHENSIVE METABOLIC PANEL - Abnormal; Notable for the following components:      Result Value   Glucose, Bld 151 (*)    All other components within normal limits  CBC WITH DIFFERENTIAL/PLATELET  TROPONIN I (HIGH SENSITIVITY)  TROPONIN I (HIGH SENSITIVITY)    EKG EKG Interpretation  Date/Time:  Tuesday May 09 2022 03:57:31 EDT Ventricular Rate:  102 PR Interval:  154 QRS Duration: 102 QT Interval:  374 QTC Calculation: 487 R Axis:   229 Text Interpretation: Sinus tachycardia Right superior axis deviation Incomplete right bundle branch block Right ventricular hypertrophy Inferior infarct , age undetermined Possible Anterior infarct , age undetermined Abnormal ECG Confirmed by Tilden Fossa [phone]) on 05/09/2022 5:16:09 AM  Radiology DG Chest 2  View  Result Date: 05/09/2022 CLINICAL DATA:  Chest pain and shortness of breath. EXAM: CHEST - 2 VIEW COMPARISON:  01/04/2008 FINDINGS: The heart size and mediastinal contours are within normal limits. Mild atelectasis is noted at the lung bases. No consolidation, effusion, or pneumothorax. Degenerative changes are present in thoracic spine. IMPRESSION: Mild atelectasis at the lung bases. Electronically Signed   By: Thornell Sartorius M.D.   On: 05/09/2022 04:27    Procedures Procedures    Medications Ordered in ED Medications - No data to display  ED Course/ Medical Decision Making/ A&P                           Medical Decision Making Amount and/or Complexity of Data Reviewed Labs: ordered.  Risk OTC drugs. Prescription drug management.   Patient with history of diabetes, hypertension, coronary artery disease status post RCA stent for late presenting inferior MI on March 14, 2021 here for evaluation of left-sided chest pain that began last night.  EKG is abnormal, there is no recent prior available for comparison.  Troponins are negative x2.  D-dimer is negative based off of age adjustment.  Given concerning history as well as significantly abnormal EKG cardiology consulted regarding recommendations for further work-up of her chest pain.  Current clinical picture is not consistent with PE, pneumonia, dissection. Pain is resolved after fentanyl administration in the ED.  Pt care transferred pending cards eval.          Final Clinical Impression(s) / ED Diagnoses Final diagnoses:  None    Rx / DC Orders ED Discharge Orders     None         Tilden Fossa, MD 05/09/22 [phone]

## 2022-05-09 NOTE — Discharge Instructions (Signed)
Follow-up with the recommendations provided by the cardiology team

## 2023-03-09 IMAGING — DX DG CHEST 2V
2 series · 2 of 2 positions shown · non-contrast
Comparison: 01/04/2008

CLINICAL DATA: Chest pain and shortness of breath.

EXAM:
CHEST - 2 VIEW

[chest pa]
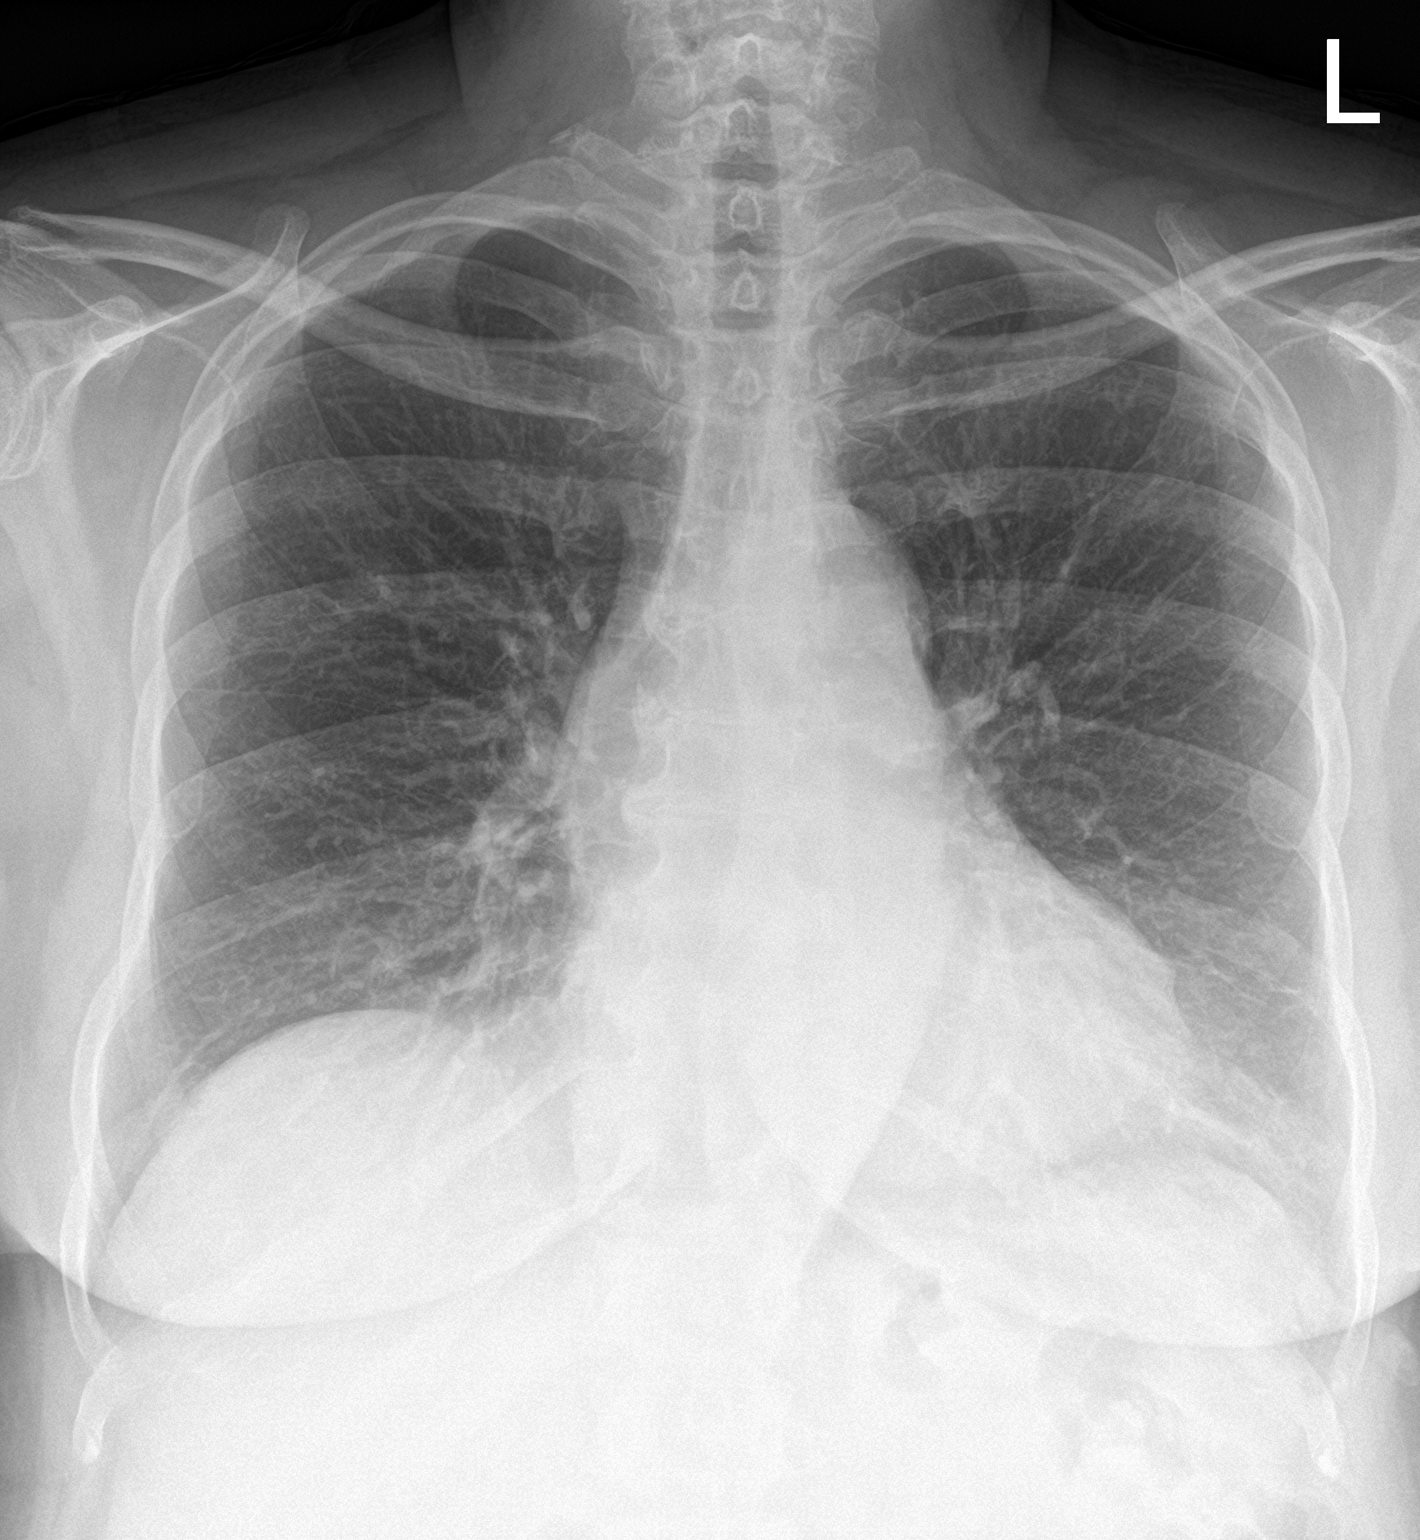

[chest lat]
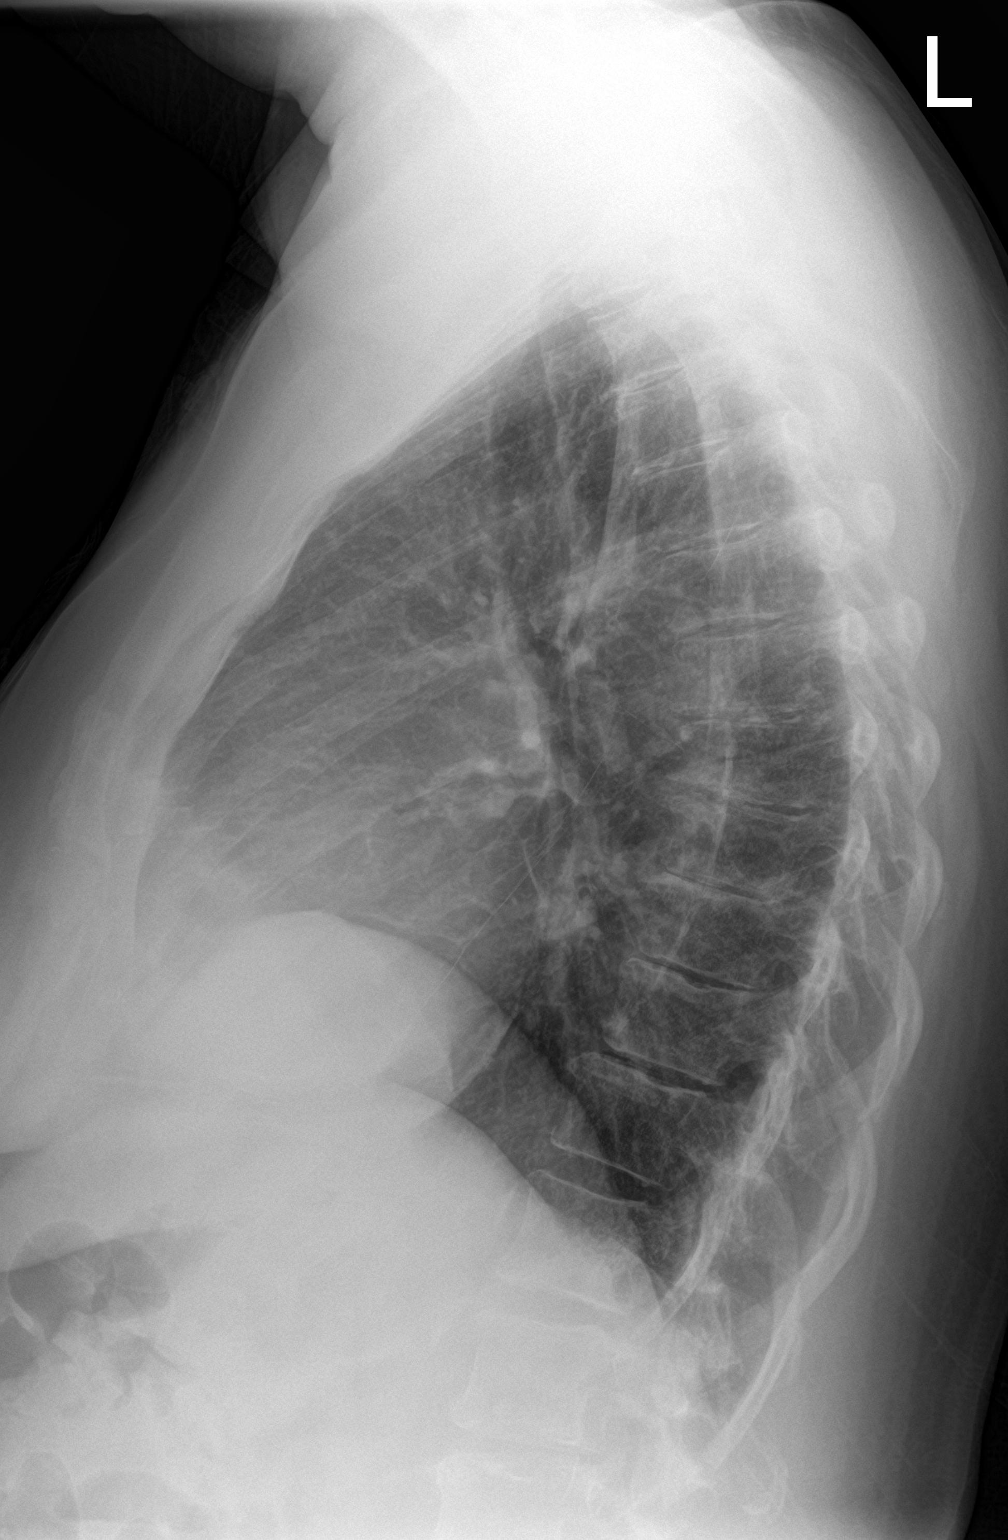

[2 of 2 positions shown; findings below may reference images not displayed]

FINDINGS: The heart size and mediastinal contours are within normal limits.
Mild atelectasis is noted at the lung bases. No consolidation,
effusion, or pneumothorax. Degenerative changes are present in
thoracic spine.
IMPRESSION: Mild atelectasis at the lung bases.

## 2024-04-17 DIAGNOSIS — C50919 Malignant neoplasm of unspecified site of unspecified female breast: Secondary | ICD-10-CM

## 2024-04-17 HISTORY — DX: Malignant neoplasm of unspecified site of unspecified female breast: C50.919

## 2024-05-06 ENCOUNTER — Inpatient Hospital Stay
Admission: RE | Admit: 2024-05-06 | Discharge: 2024-05-06 | Disposition: A | Discharge status: Home / Self Care | Payer: Self-pay | Source: Ambulatory Visit | Attending: Radiation Oncology | Admitting: Radiation Oncology

## 2024-05-06 ENCOUNTER — Other Ambulatory Visit: Payer: Self-pay | Admitting: Radiation Oncology

## 2024-05-06 ENCOUNTER — Telehealth: Payer: Self-pay | Admitting: Radiation Oncology

## 2024-05-06 DIAGNOSIS — C50412 Malignant neoplasm of upper-outer quadrant of left female breast: Secondary | ICD-10-CM

## 2024-05-06 NOTE — Telephone Encounter (Signed)
 Called patient to schedule a consultation w. Dr. Mitzi Hansen. No answer, LVM for a return call.

## 2024-05-08 ENCOUNTER — Telehealth: Payer: Self-pay | Admitting: Radiation Oncology

## 2024-05-08 NOTE — Telephone Encounter (Signed)
 Called patient to reschedule consult w. Dr. Jeryl Moris for a sooner appointment. Patient stated she would prefer to keep her original date and time.

## 2024-05-08 NOTE — Telephone Encounter (Signed)
 Called patient to reschedule consultation w. Dr. Jeryl Moris for a sooner appointment. No answer, LVM for a return call.

## 2024-05-15 NOTE — Progress Notes (Signed)
 New Breast Cancer Diagnosis:Left Breast UOQ  Left breast ultrasound-guided core biopsy x2 performed at the [facility] in [place] revealing invasive lobular carcinoma in a background of extensive LCIS at the 12:00 position 3 cm from the nipple and invasive mammary carcinoma with mixed ductal and lobular features with small focus of LCIS at the 11:30 position 7 cm from the nipple.    MRI Breast 05/13/2024: Results pending.  Histology per Pathology Report: grade 1, Invasive Lobular Carcinoma with LCIS 04/20/2024  Receptor Status: ER(positive), PR (weak positive), Her2-neu (negative), Ki-(<10%)   Surgeon and surgical plan, if any:    Medical oncologist, treatment if any:    Family History of Breast/Ovarian/Prostate Cancer: None  Lymphedema issues, if any: No      Pain issues, if any: No    SAFETY ISSUES: Prior radiation? No Pacemaker/ICD? No Possible current pregnancy? Postmenopausal Is the patient on methotrexate? No  Current Complaints / other details:  Stent

## 2024-05-15 NOTE — Progress Notes (Signed)
 Radiation Oncology         [phone] ________________________________  Name: Taylor Frazier        MRN: [id number]  Date of Service: 05/16/2024 DOB: [birth year]  CC:Pcp, No  Ardath Bears, MD     REFERRING PHYSICIAN: Ardath Bears, MD   DIAGNOSIS: There were no encounter diagnoses.   HISTORY OF PRESENT ILLNESS: Taylor Frazier is a 68 y.o. female seen at the request of Dr. Deatra Face for newly diagnosed left breast cancer. She originally presented with a 59-month history of a palpable area in her left breast at the 12:00 location and chronic nipple inversion. Subsequent ultrasound demonstrated a 1.3 cm mass at the 12:00 position 3 cmfn; a 5 mm hypoechoic mass at the 11:30 position 7 cmfn; ill-defined hyopechoic areas within the region of the dilated ducts at the nipple; and other scattered satellite areas within the left breast. Patient proceeded with a biopsy that revealed *** . Prognostic indicators significant for: estrogen receptor 90-100% strongly positive; progesterone receptor ***; Proliferation marker Ki67 at ***; Her2 status ***; Grade 1.    PREVIOUS RADIATION THERAPY: {EXAM; YES/NO:19492::"No"}   PAST MEDICAL HISTORY:  Past Medical History:  Diagnosis Date   Coronary artery disease 02/10/2021   STEMI Late Presenting DES RCA at [facility]   Diabetes mellitus without complication (HCC)    Hyperlipidemia    Hypertension        PAST SURGICAL HISTORY: Past Surgical History:  Procedure Laterality Date   CARDIAC CATHETERIZATION       FAMILY HISTORY:  Family History  Problem Relation Age of Onset   Heart disease Brother    Heart attack Brother      SOCIAL HISTORY:  reports that she has never smoked. She has never used smokeless tobacco. She reports that she does not currently use alcohol. She reports that she does not use drugs.   ALLERGIES: Patient has no known allergies.   MEDICATIONS:  Current Outpatient Medications  Medication Sig  Dispense Refill   aspirin  81 MG chewable tablet Chew 81 mg by mouth daily.     atorvastatin (LIPITOR) 80 MG tablet Take 80 mg by mouth daily. (Patient not taking: Reported on 05/09/2022)     Cholecalciferol 25 MCG (1000 UT) tablet Take 2,000 Units by mouth daily.     CINNAMON PO Take 1 capsule by mouth in the morning and at bedtime.     ferrous sulfate 325 (65 FE) MG EC tablet Take 325 mg by mouth every Monday, Wednesday, and Friday. (Patient not taking: Reported on 05/09/2022)     ipratropium (ATROVENT) 0.06 % nasal spray Place 2 sprays into both nostrils in the morning and at bedtime. (Patient not taking: Reported on 05/09/2022)     lisinopril (ZESTRIL) 20 MG tablet Take 20 mg by mouth daily.     metFORMIN (GLUCOPHAGE) 500 MG tablet Take 500 mg by mouth 2 (two) times daily with a meal.     metoprolol succinate (TOPROL-XL) 25 MG 24 hr tablet Take 12.5 mg by mouth daily.     Multiple Vitamin (MULTIVITAMIN WITH MINERALS) TABS tablet Take 1 tablet by mouth daily. (Patient not taking: Reported on 05/09/2022)     nitroGLYCERIN (NITROSTAT) 0.4 MG SL tablet Place 0.4 mg under the tongue every 5 (five) minutes as needed for chest pain.     OVER THE COUNTER MEDICATION Take 1 tablet by mouth in the morning and at bedtime. Goji super greens/fruits     prasugrel (EFFIENT) 10  MG TABS tablet Take 10 mg by mouth daily. (Patient not taking: Reported on 05/09/2022)     SPIRULINA PO Take 2 tablets by mouth in the morning and at bedtime.     terbinafine (LAMISIL) 1 % cream Apply 1 application. topically 2 (two) times daily. To feet (Patient not taking: Reported on 05/09/2022)     urea 10 % lotion Apply 1 application. topically daily. (Patient not taking: Reported on 05/09/2022)     No current facility-administered medications for this visit.     REVIEW OF SYSTEMS: On review of systems, the patient reports that *** is doing well overall. *** denies any chest pain, shortness of breath, cough, fevers, chills, night  sweats, unintended weight changes. *** denies any bowel or bladder disturbances, and denies abdominal pain, nausea or vomiting. *** denies any new musculoskeletal or joint aches or pains. A complete review of systems is obtained and is otherwise negative.     PHYSICAL EXAM:  Wt Readings from Last 3 Encounters:  07/22/21 191 lb 2.2 oz (86.7 kg)  05/31/21 190 lb 0.6 oz (86.2 kg)   Temp Readings from Last 3 Encounters:  05/09/22 98.5 F (36.9 C)   BP Readings from Last 3 Encounters:  05/09/22 (!) 143/96  05/31/21 112/68   Pulse Readings from Last 3 Encounters:  05/09/22 79  05/31/21 77    /10  In general this is a well appearing *** in no acute distress. ***'s alert and oriented x4 and appropriate throughout the examination. Cardiopulmonary assessment is negative for acute distress and *** exhibits normal effort.     ECOG = ***  0 - Asymptomatic (Fully active, able to carry on all predisease activities without restriction)  1 - Symptomatic but completely ambulatory (Restricted in physically strenuous activity but ambulatory and able to carry out work of a light or sedentary nature. For example, light housework, office work)  2 - Symptomatic, <50% in bed during the day (Ambulatory and capable of all self care but unable to carry out any work activities. Up and about more than 50% of waking hours)  3 - Symptomatic, >50% in bed, but not bedbound (Capable of only limited self-care, confined to bed or chair 50% or more of waking hours)  4 - Bedbound (Completely disabled. Cannot carry on any self-care. Totally confined to bed or chair)  5 - Death   Aurea Blossom MM, Creech RH, Tormey DC, et al. [phone]). "Toxicity and response criteria of the [facility]". Am. Hillard Lowes. Oncol. 5 (6): 649-55    LABORATORY DATA:  Lab Results  Component Value Date   WBC 5.8 05/09/2022   HGB 13.1 05/09/2022   HCT 41.9 05/09/2022   MCV 88.2 05/09/2022   PLT 168 05/09/2022   Lab  Results  Component Value Date   NA 138 05/09/2022   K 3.7 05/09/2022   CL 106 05/09/2022   CO2 23 05/09/2022   Lab Results  Component Value Date   ALT 31 05/09/2022   AST 26 05/09/2022   GGT 29 07/10/2016   ALKPHOS 68 05/09/2022   BILITOT 0.8 05/09/2022      RADIOGRAPHY: No results found.     IMPRESSION/PLAN: 1. ***  This is a lovely patient with a diagnosis of breast cancer. Dr. Jeryl Moris recommends adjuvant radiotherapy to the *** breast in order to decrease her risk of locoregional recurrence by ***.   We discussed the risks, benefits, and side effects of radiotherapy. We discussed that radiation would take approximately ***  weeks to complete. We spoke about acute effects including skin irritation and fatigue as well as much less common late effects including injury to internal organs of the chest. We spoke about the latest technology that is used to minimize the risk of late effects for breast cancer patients undergoing radiotherapy. No guarantees of treatment were given. She is enthusiastic about proceeding with treatment.    We will await her referral back to us  after her lumpectomy. Patient knows to call with any questions or concerns in the meantime. We look forward to participating in her care.  In a visit lasting *** minutes, greater than 50% of the time was spent face to face discussing the patient's condition, in preparation for the discussion, and coordinating the patient's care.   The above documentation reflects my direct findings during this shared patient visit. Please see the separate note by Dr. Jeryl Moris on this date for the remainder of the patient's plan of care.    Amiel Kalata, PA-C   **Disclaimer: This note was dictated with voice recognition software. Similar sounding words can inadvertently be transcribed and this note may contain transcription errors which may not have been corrected upon publication of note.**

## 2024-05-16 ENCOUNTER — Encounter: Payer: Self-pay | Admitting: Radiation Oncology

## 2024-05-16 ENCOUNTER — Ambulatory Visit
Admission: RE | Admit: 2024-05-16 | Discharge: 2024-05-16 | Disposition: A | Discharge status: Home / Self Care | Source: Ambulatory Visit | Attending: Radiation Oncology | Admitting: Radiation Oncology

## 2024-05-16 VITALS — BP 167/99 | HR 88 | Temp 97.5°F | Resp 20 | Wt 191.0 lb

## 2024-05-16 DIAGNOSIS — I10 Essential (primary) hypertension: Secondary | ICD-10-CM | POA: Insufficient documentation

## 2024-05-16 DIAGNOSIS — Z7982 Long term (current) use of aspirin: Secondary | ICD-10-CM | POA: Insufficient documentation

## 2024-05-16 DIAGNOSIS — C50412 Malignant neoplasm of upper-outer quadrant of left female breast: Secondary | ICD-10-CM | POA: Insufficient documentation

## 2024-05-16 DIAGNOSIS — E785 Hyperlipidemia, unspecified: Secondary | ICD-10-CM | POA: Diagnosis not present

## 2024-05-16 DIAGNOSIS — Z79899 Other long term (current) drug therapy: Secondary | ICD-10-CM | POA: Diagnosis not present

## 2024-05-16 DIAGNOSIS — I2510 Atherosclerotic heart disease of native coronary artery without angina pectoris: Secondary | ICD-10-CM | POA: Diagnosis not present

## 2024-05-16 DIAGNOSIS — Z170 Estrogen receptor positive status [ER+]: Secondary | ICD-10-CM | POA: Diagnosis not present

## 2024-05-16 DIAGNOSIS — E119 Type 2 diabetes mellitus without complications: Secondary | ICD-10-CM | POA: Insufficient documentation

## 2024-05-16 DIAGNOSIS — I252 Old myocardial infarction: Secondary | ICD-10-CM | POA: Diagnosis not present

## 2024-05-16 DIAGNOSIS — Z7984 Long term (current) use of oral hypoglycemic drugs: Secondary | ICD-10-CM | POA: Insufficient documentation

## 2024-05-16 DIAGNOSIS — N6042 Mammary duct ectasia of left breast: Secondary | ICD-10-CM | POA: Diagnosis not present

## 2024-07-02 ENCOUNTER — Ambulatory Visit: Admitting: Radiation Oncology

## 2024-07-04 ENCOUNTER — Encounter: Payer: Self-pay | Admitting: Radiation Oncology

## 2024-07-04 ENCOUNTER — Ambulatory Visit: Admission: RE | Admit: 2024-07-04 | Source: Ambulatory Visit | Admitting: Radiation Oncology

## 2024-07-04 NOTE — Progress Notes (Signed)
 The patient presented today for her diagnosis of left-sided breast cancer.  She is status post mastectomy which revealed a multifocal T2N1M I M0 tumor.  The largest focus was 2.1 cm and widely negative margins were achieved.  2 lymph nodes were removed, one with a micrometastasis.  I discussed the potential benefit of adjuvant radiation treatment with the patient today.  We discussed that there could be some benefit in local control given the micrometastasis.  However, this represents a small amount of disease and the surgery with respect to the mastectomy appears to have gone very well.  We discussed the pros and cons as well as what would be entailed in treatment which would correspond to 6-1/2 weeks.  All of her questions were answered today.  We had a good discussion and the patient decided ultimately that she did not wish to proceed with radiation treatment.  I believe this is reasonable given her pathology report and her wishes.  She will return to clinic on a as needed basis but certainly she knows to contact our office if we can be of any further assistance.  ------------------------------------------------  Norleen CANDIE Limes, MD, PhD

## 2024-07-14 ENCOUNTER — Ambulatory Visit: Admitting: Radiation Oncology

## 2024-07-15 ENCOUNTER — Ambulatory Visit

## 2024-07-16 ENCOUNTER — Ambulatory Visit

## 2024-07-17 ENCOUNTER — Ambulatory Visit

## 2024-07-18 ENCOUNTER — Ambulatory Visit

## 2024-07-21 ENCOUNTER — Ambulatory Visit

## 2024-07-22 ENCOUNTER — Telehealth: Payer: Self-pay | Admitting: Radiation Oncology

## 2024-07-22 ENCOUNTER — Ambulatory Visit

## 2024-07-22 NOTE — Telephone Encounter (Signed)
 8/5 Received Medical Record request from Pleasant Buffy, RN [facility]). Records faxed.

## 2024-07-23 ENCOUNTER — Ambulatory Visit

## 2024-07-24 ENCOUNTER — Ambulatory Visit

## 2024-07-25 ENCOUNTER — Ambulatory Visit

## 2024-07-28 ENCOUNTER — Ambulatory Visit

## 2024-07-29 ENCOUNTER — Ambulatory Visit

## 2024-07-30 ENCOUNTER — Ambulatory Visit

## 2024-07-31 ENCOUNTER — Ambulatory Visit

## 2024-08-01 ENCOUNTER — Ambulatory Visit

## 2024-08-01 ENCOUNTER — Ambulatory Visit: Admitting: Radiation Oncology

## 2024-08-04 ENCOUNTER — Ambulatory Visit

## 2024-08-05 ENCOUNTER — Ambulatory Visit

## 2024-08-06 ENCOUNTER — Ambulatory Visit

## 2024-08-07 ENCOUNTER — Ambulatory Visit

## 2024-08-08 ENCOUNTER — Ambulatory Visit
